# Patient Record
Sex: Male | Born: 1950 | Race: Black or African American | Hispanic: No | Marital: Married | Smoking: Former smoker
Health system: Southern US, Community
[De-identification: ages and names within clinical notes are randomized; demographics above are authoritative.]

## PROBLEM LIST (undated history)

## (undated) DIAGNOSIS — C801 Malignant (primary) neoplasm, unspecified: Secondary | ICD-10-CM

## (undated) DIAGNOSIS — IMO0002 Reserved for concepts with insufficient information to code with codable children: Secondary | ICD-10-CM

## (undated) DIAGNOSIS — S62609A Fracture of unspecified phalanx of unspecified finger, initial encounter for closed fracture: Secondary | ICD-10-CM

---

## 1997-12-16 ENCOUNTER — Encounter: Admission: RE | Admit: 1997-12-16 | Discharge: 1998-03-16 | Payer: Self-pay | Admitting: Psychiatry

## 1999-09-23 ENCOUNTER — Emergency Department (HOSPITAL_COMMUNITY): Admission: EM | Admit: 1999-09-23 | Discharge: 1999-09-23 | Payer: Self-pay | Admitting: Emergency Medicine

## 2000-10-06 ENCOUNTER — Encounter: Payer: Self-pay | Admitting: Emergency Medicine

## 2000-10-06 ENCOUNTER — Emergency Department (HOSPITAL_COMMUNITY): Admission: EM | Admit: 2000-10-06 | Discharge: 2000-10-06 | Payer: Self-pay | Admitting: Emergency Medicine

## 2002-05-12 ENCOUNTER — Encounter: Payer: Self-pay | Admitting: Emergency Medicine

## 2002-05-12 ENCOUNTER — Emergency Department (HOSPITAL_COMMUNITY): Admission: EM | Admit: 2002-05-12 | Discharge: 2002-05-12 | Payer: Self-pay | Admitting: Emergency Medicine

## 2002-10-16 ENCOUNTER — Encounter: Payer: Self-pay | Admitting: Family Medicine

## 2002-10-16 ENCOUNTER — Ambulatory Visit (HOSPITAL_COMMUNITY): Admission: RE | Admit: 2002-10-16 | Discharge: 2002-10-16 | Payer: Self-pay | Admitting: Family Medicine

## 2002-12-10 ENCOUNTER — Encounter: Payer: Self-pay | Admitting: Family Medicine

## 2002-12-10 ENCOUNTER — Ambulatory Visit (HOSPITAL_COMMUNITY): Admission: RE | Admit: 2002-12-10 | Discharge: 2002-12-10 | Payer: Self-pay | Admitting: Family Medicine

## 2003-01-20 ENCOUNTER — Ambulatory Visit (HOSPITAL_COMMUNITY): Admission: RE | Admit: 2003-01-20 | Discharge: 2003-01-20 | Payer: Self-pay

## 2003-01-20 ENCOUNTER — Encounter: Payer: Self-pay | Admitting: Family Medicine

## 2003-03-17 ENCOUNTER — Other Ambulatory Visit: Admission: RE | Admit: 2003-03-17 | Discharge: 2003-03-17 | Payer: Self-pay | Admitting: Urology

## 2003-04-10 HISTORY — PX: PROSTATECTOMY: SHX69

## 2003-04-19 ENCOUNTER — Inpatient Hospital Stay (HOSPITAL_COMMUNITY): Admission: RE | Admit: 2003-04-19 | Discharge: 2003-04-24 | Payer: Self-pay | Admitting: Urology

## 2003-12-15 ENCOUNTER — Emergency Department (HOSPITAL_COMMUNITY): Admission: EM | Admit: 2003-12-15 | Discharge: 2003-12-15 | Payer: Self-pay | Admitting: Emergency Medicine

## 2004-02-15 ENCOUNTER — Ambulatory Visit (HOSPITAL_COMMUNITY): Admission: RE | Admit: 2004-02-15 | Discharge: 2004-02-15 | Payer: Self-pay | Admitting: Family Medicine

## 2004-02-23 ENCOUNTER — Ambulatory Visit (HOSPITAL_COMMUNITY): Admission: RE | Admit: 2004-02-23 | Discharge: 2004-02-23 | Payer: Self-pay | Admitting: Family Medicine

## 2004-11-07 ENCOUNTER — Emergency Department (HOSPITAL_COMMUNITY): Admission: EM | Admit: 2004-11-07 | Discharge: 2004-11-07 | Payer: Self-pay | Admitting: Emergency Medicine

## 2006-07-04 ENCOUNTER — Emergency Department (HOSPITAL_COMMUNITY): Admission: EM | Admit: 2006-07-04 | Discharge: 2006-07-04 | Payer: Self-pay | Admitting: Emergency Medicine

## 2006-09-04 ENCOUNTER — Ambulatory Visit: Payer: Self-pay | Admitting: Internal Medicine

## 2006-09-13 ENCOUNTER — Ambulatory Visit (HOSPITAL_COMMUNITY): Admission: RE | Admit: 2006-09-13 | Discharge: 2006-09-13 | Payer: Self-pay | Admitting: Internal Medicine

## 2006-09-13 ENCOUNTER — Encounter (INDEPENDENT_AMBULATORY_CARE_PROVIDER_SITE_OTHER): Payer: Self-pay | Admitting: Internal Medicine

## 2006-09-13 ENCOUNTER — Ambulatory Visit: Payer: Self-pay | Admitting: Internal Medicine

## 2007-03-04 ENCOUNTER — Ambulatory Visit (HOSPITAL_COMMUNITY): Admission: RE | Admit: 2007-03-04 | Discharge: 2007-03-04 | Payer: Self-pay | Admitting: Family Medicine

## 2008-02-08 ENCOUNTER — Emergency Department (HOSPITAL_COMMUNITY): Admission: EM | Admit: 2008-02-08 | Discharge: 2008-02-08 | Payer: Self-pay | Admitting: Emergency Medicine

## 2008-03-15 ENCOUNTER — Emergency Department (HOSPITAL_COMMUNITY): Admission: EM | Admit: 2008-03-15 | Discharge: 2008-03-15 | Payer: Self-pay | Admitting: Emergency Medicine

## 2008-04-20 ENCOUNTER — Ambulatory Visit (HOSPITAL_COMMUNITY): Admission: RE | Admit: 2008-04-20 | Discharge: 2008-04-20 | Payer: Self-pay | Admitting: Orthopaedic Surgery

## 2009-06-23 ENCOUNTER — Ambulatory Visit (HOSPITAL_COMMUNITY): Admission: RE | Admit: 2009-06-23 | Discharge: 2009-06-23 | Payer: Self-pay | Admitting: Family Medicine

## 2009-06-30 ENCOUNTER — Emergency Department (HOSPITAL_COMMUNITY)
Admission: EM | Admit: 2009-06-30 | Discharge: 2009-06-30 | Payer: Self-pay | Source: Home / Self Care | Admitting: Emergency Medicine

## 2010-08-22 NOTE — Op Note (Signed)
[name], [name]                  ACCOUNT NO.:  [id number]   MEDICAL RECORD NO.:  [id number]          PATIENT TYPE:  AMB   LOCATION:  DAY                           FACILITY:  [facility]   PHYSICIAN:  Lionel December, M.D.    DATE OF BIRTH:  [birth year]   DATE OF PROCEDURE:  09/13/2006  DATE OF DISCHARGE:                               OPERATIVE REPORT   PROCEDURE:  Esophagogastroduodenoscopy followed by colonoscopy with  polypectomy.   INDICATIONS:  [name] is a 60 year old African American male with chronic  heartburn and recurrent epigastric pain.  He is undergoing diagnostic  EGD.  He is also undergoing average risk screening colonoscopy.  Procedure and risks were reviewed with the patient and informed consent  was obtained.   MEDS FOR CONSCIOUS SEDATION:  Benzocaine spray for pharyngeal topical  anesthesia, Demerol 75 mg IV, Versed 10 mg IV in divided doses.   FINDINGS:  Procedure performed in endoscopy suite.  The patient's vital  signs and O2 sat were monitored during the procedure and remained  stable.   PROCEDURE #1:  ESOPHAGOGASTRODUODENOSCOPY:  The patient was placed in  the left lateral recumbent position. The Pentax videoscope was passed  via oropharynx without any difficulty into the esophagus.   ESOPHAGUS:  The mucosa of the esophagus was normal.  GE junction was at  41 cm and hiatus was at 43 cm.  He had a 2 cm long sliding hiatal  hernia.   STOMACH:  It was empty and distended very well with insufflation.  Folds  in the proximal stomach were normal.  Examination of mucosa at body,  antrum, pyloric channel as well as angularis, fundus and cardia was  normal.   DUODENUM:  Bulbar mucosa was normal.  The scope was passed in the second  part of the duodenum where mucosa and folds were normal.  Endoscope was  withdrawn and the patient prepared for procedure #2.   PROCEDURE #2:  COLONOSCOPY:  Rectal examination performed.  No  abnormality noted on external or digital exam.   The Pentax videoscope  was placed in the rectum and advanced under vision into the sigmoid  colon and beyond.  Preparation was satisfactory.  The scope was passed  into the cecum which was identified by the appendiceal orifice and  ileocecal valve.  Pictures were taken for the record.  As the scope was  withdrawn the colonic mucosa was carefully examined.  There was an 8 mm  pedunculated polyp at the proximal sigmoid colon which was snared and  retrieved for histologic examination.  Mucosa of the rest of the colon  was normal.  Rectal mucosa similarly was normal.  Scope was retroflexed  to examine anorectal junction and small hemorrhoids were noted below the  dentate line.  Endoscope was straightened and withdrawn.  The patient  tolerated the procedure well.   FINAL DIAGNOSES:  1. Small sliding hiatal hernia.  Otherwise normal EGD.  2. An 8 mm pedunculated polyp snared from proximal sigmoid colon.  3. Small external hemorrhoids.   RECOMMENDATIONS:  Standard instructions were given.  He should  continue  antireflux measures and omeprazole 20 mg q.a.m. and he should try to  refrain from drinking alcohol as it seemed to trigger his symptoms.   I will be contacting the patient with results of biopsy and further  recommendations.      Lionel December, M.D.  Electronically Signed     NR/MEDQ  D:  09/13/2006  T:  09/13/2006  Job:  [id number]   cc:   Corrie Mckusick, M.D.  Fax: [phone]

## 2010-08-22 NOTE — Consult Note (Signed)
[name], [name]                  ACCOUNT NO.:  [id number]   MEDICAL RECORD NO.:  [id number]          PATIENT TYPE:  AMB   LOCATION:                                FACILITY:  [facility]   PHYSICIAN:  Lionel December, M.D.    DATE OF BIRTH:  [birth year]   DATE OF CONSULTATION:  DATE OF DISCHARGE:                                 CONSULTATION   REASON FOR CONSULTATION/CHIEF COMPLAINT:  Daily indigestion and  heartburn and epigastric pain.   HISTORY OF PRESENT ILLNESS:  [name] is a 60 year old male who tells me  he has had symptoms intermittently over many years of intermittent  indigestion and heartburn.  He tells me the symptoms have been worse  over the last couple of weeks.  He is having symptoms around 4 days a  week where he has severe heartburn and indigestion, usually worse after  he has been binge-drinking.  He tells me he usually has a couple of  beers 3-4 days a week, but also has been known to have up to a full case  of beer.  He notes his epigastric pain is worse after the consumption of  alcohol.  He denies any nausea, vomiting, dysphagia, odynophagia or  lower abdominal pain.  He denies any rectal bleeding or melena.  Denies  any anorexia or early satiety.  Denies any diarrhea or constipation.  He  has been on Nexium 40 mg daily for about 3 weeks which does seem to  help.   PAST MEDICAL HISTORY:  Includes prostate cancer status post radical  prostatectomy in January 2005.  He has history of acute duodenitis in  1984 on EGD by Dr. Crista Luria.  He has a remote history of peptic ulcer  disease dating back to the 1970s.  Spontaneous left pneumothorax twice  in 1986 and in 1990.  Left knee arthroscopy for a meniscal tear back in  the 1990s   CURRENT MEDICATIONS:  Denies any.   ALLERGIES:  CODEINE.   FAMILY HISTORY:  Positive for a brother diagnosed with colon cancer at  age 43.  Mother at age 78 has hypertension.  Father deceased at age 8  due to MVA.  Family history is also  positive for diabetes mellitus and  hypercholesterolemia.   SOCIAL HISTORY:  [name] is married.  He has two grown healthy children.  He is a self-employed Copy.  He has a 40 pack-year history of tobacco  use.  He has a history of past marijuana use but denies any IV or  intranasal drug use.  See HPI for alcohol consumption.   REVIEW OF SYSTEMS:  CONSTITUTIONAL:  Weight has remained stable.  Denies  any fever or chills.  CARDIOVASCULAR:  Denies any chest pain or  palpitation.  PULMONARY:  Denies any shortness of breath, dyspnea, cough  or hemoptysis.  GI:  See HPI.  GU:  Denies any dysuria, hematuria, or  increased urinary frequency.   PHYSICAL EXAMINATION:  VITAL SIGNS:  Weight 165 pounds, height 74  inches, temperature 98.1, blood pressure 110/60, pulse 72.  GENERAL:  [name] is a well-developed, well-nourished African-American  male in no acute distress.  HEENT.  Sclerae clear, nonicteric.  Conjunctivae pink.  Oropharynx pink  and moist without any lesions.  NECK:  Supple without mass or thyromegaly.  CHEST:  Heart regular and rhythm, normal S1, S2, without any murmurs,  clicks, rubs or gallops.  Lungs clear to auscultation bilaterally.  ABDOMEN:  Positive bowel sounds x4.  No bruits auscultated.  Soft,  nontender, nondistended, without palpable mass or hepatosplenomegaly.  No rebound tenderness or guarding.  EXTREMITIES:  Without clubbing or edema bilaterally.  SKIN:  Brown, warm and dry without any rash or jaundice.   IMPRESSION:  [name] is a 60 year old male with a history of frequent  heartburn and indigestion as well as epigastric pain.  His symptoms are  made worse with episodic increased alcohol consumption.  I suspect he  may have gastritis or could have even developed peptic ulcer disease as  he carries history of this.  He is going to require further evaluation.  He has responded to PPI.   He also has a family history of colon cancer and will need high-risk   screening colonoscopy at the same time.   PLAN:  1. Omeprazole 20 mg daily #31 with five refills.  2. Screening colonoscopy and EGD with Dr. Karilyn Cota in the near future.      I have discussed the procedure including risks and benefits which      include but are not limited to bleeding, infection, perforation,      drug reaction.  He agrees to the plan.  Consent will be obtained.   We would like to thank Dr. Phillips Odor for allowing [place] to participate in the  care of Mr. Maiden.      Nicholas Lose, N.P.      Lionel December, M.D.  Electronically Signed    KC/MEDQ  D:  09/04/2006  T:  09/04/2006  Job:  [id number]   cc:   Corrie Mckusick, M.D.  Fax: [phone]

## 2010-08-25 ENCOUNTER — Other Ambulatory Visit (HOSPITAL_COMMUNITY): Payer: Self-pay | Admitting: Family Medicine

## 2010-08-25 ENCOUNTER — Ambulatory Visit (HOSPITAL_COMMUNITY)
Admission: RE | Admit: 2010-08-25 | Discharge: 2010-08-25 | Disposition: A | Discharge status: Home / Self Care | Payer: 59 | Source: Ambulatory Visit | Attending: Family Medicine | Admitting: Family Medicine

## 2010-08-25 DIAGNOSIS — R079 Chest pain, unspecified: Secondary | ICD-10-CM

## 2010-08-25 DIAGNOSIS — R918 Other nonspecific abnormal finding of lung field: Secondary | ICD-10-CM | POA: Insufficient documentation

## 2010-08-25 DIAGNOSIS — R0789 Other chest pain: Secondary | ICD-10-CM | POA: Insufficient documentation

## 2010-08-25 NOTE — H&P (Signed)
[name], [name] NO.:  [id number]   MEDICAL RECORD NO.:  [id number]                  PATIENT TYPE:   LOCATION:                                       FACILITY:   PHYSICIAN:  Ky Barban, M.D.            DATE OF BIRTH:   DATE OF ADMISSION:  DATE OF DISCHARGE:                                HISTORY & PHYSICAL   CHIEF COMPLAINT:  Carcinoma of prostate.   HISTORY OF PRESENT ILLNESS:  The patient is a 60 year old gentleman who was  referred to me by Dr. Phillips Odor on February 11, 2003 because his PSA was 5.47  and he has no urological complaints.  I repeated his PSA and it has already  gone up from 5.47 to 6.34 and free PSA is only 9%.  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  Because of his age I recommended that we need  to decide between having a prostatectomy versus radiotherapy.  I discussed  with him and his wife pros and cons of both procedures because of younger  age and I recommended that he undergo radical prostatectomy.  If he is  agreeable especially these three things (1) urinary incontinence which can  become permanent, (2) erectile dysfunction, he will develop impotence and  (3) blood transfusion and he wanted me to go ahead and do a radical  prostatectomy.  He is willing to take these risks.  He is otherwise  medically having no other medical problems.   PAST MEDICAL HISTORY:  No history of diabetes or hypertension.   FAMILY HISTORY:  One brother has prostate cancer.   PERSONAL HISTORY:  Patient smokes one pack per day.  Does drink but doesn't  know how much.   REVIEW OF SYMPTOMS:  Unremarkable.   PHYSICAL EXAMINATION:  VITAL SIGNS:  Blood pressure 126/70, temperature 98.  CNS:  No gross neurological deficits.  HEENT:  Negative.  CHEST:  Symmetrical.  HEART:   Regular sinus rhythm.  ABDOMEN:  Soft, flat.  Liver, spleen, kidneys not palpable.  No  costovertebral angle tenderness.  GENITOURINARY:  Circumcised.  Meatus is adequate.  Testicles normal.  RECTAL:  Normal sphincter tone.  No rectal masses.  Prostate 1+, smooth and  firm.   IMPRESSION:  Carcinoma of prostate.   PLAN:  Bilateral pelvic node dissection, frozen sections and radical  retropubic prostatectomy.                                                Ky Barban, M.D.   MIJ/MEDQ  D:  04/16/2003  T:  04/16/2003  Job:  [id number]

## 2010-08-25 NOTE — Op Note (Signed)
NAME:  JAZZ, ROGALA                            ACCOUNT NO.:  [id number]   MEDICAL RECORD NO.:  [id number]                   PATIENT TYPE:  AMB   LOCATION:  DAY                                  FACILITY:  [facility]   PHYSICIAN:  Ky Barban, M.D.            DATE OF BIRTH:  1950-04-19   DATE OF PROCEDURE:  04/19/2003  DATE OF DISCHARGE:                                 OPERATIVE REPORT   PREOPERATIVE DIAGNOSIS:  Carcinoma of prostate.   POSTOPERATIVE DIAGNOSIS:  Carcinoma of prostate.   PROCEDURE:  Bilateral pelvic node dissection, radical prostatectomy.   ANESTHESIA:  General endotracheal.   SURGEON:  Ky Barban, M.D.   ASSISTANT:  Dennie Maizes, M.D.   ESTIMATED BLOOD LOSS:  400 mL.   BLOOD REPLACED:  None.   COUNTS:  Needle, sponge, and instrument counts correct.   COMPLICATIONS:  None.   DESCRIPTION OF PROCEDURE:  The patient is given general endotracheal  anesthesia, placed in semilithotomy position.  A midline suprapubic incision  is made and carried down through the subcutaneous tissue.  The rectus sheath  is incised.  The recti separated in the midline and retropubic space is  entered.  The external iliac vessels are exposed on both sides and self-  retaining Turner-Warwick retractor is applied.  The fascia over the external  iliac vein on the right side is opened and a tissue plane is developed  between the bifurcation of the right common iliac vein and obturator  vessels.  The triangle between the right external iliac vein, obturator  vessel, the tissue which is covering the psoas fascia was removed.  Lymphatics were clipped.  Lymph nodes were removed and the obturator nerve  and vessels were skeletonized.  Specimen was sent for frozen section.  The  same procedure was repeated on the left side and I can see that the left  obturator vein is rather large, almost the size of the external iliac vein.  The report of the frozen section came back as negative  for metastatic  disease and the procedure to do a radical retropubic prostatectomy.  The  fascia, endopelvic fascia on both sides of the apex of the prostate was  open.  A McDougal clamp was passed behind the dorsal vein complex.  It was  ligated and divided.  The Foley catheter was inserted which I forgot to  insert in the initial setting.  I had to do cystoscopy because we were  having trouble putting in the Foley catheter.  There was no problem in the  urethra.  The Foley catheter was inserted.  Then we proceeded with the rest  of the procedure and the urethra was identified.  It was lifted up over the  right angle and divided.  Anterior wall of the urethra was divided and it  was sutured to the dorsal vein complex with a 3-0 Vicryl continuous stitch.  Then the  Foley catheter was grabbed with the Vanderbilt clamp and divided,  leaving the clamp on the proximal end.  The posterior urethra was then next  divided.  The prostate apex was lifted off, the rectum was pushed away and  left back. The inferior pedicle of the prostate were divided and ligated.  The fascia covering the seminal vesicle was divided.  The vas deferens were  identified.  They were clipped and divided.  Seminal vesicles were pulled  out of the sheath with blunt and sharp dissection clipping the seminal  vesicle artery.  Both seminal vesicles came out.  Then the remaining part of  the sheath covering the seminal vesicles on both sides were divided.  Superior pedicles on both sides were divided. The superior pedicles were  clamped and divided on both sides.  Now proceeded with the, anterior bladder  neck was opened up.  The bladder was opened and the ureteral orifices were  catheterized with #4 ureteral catheters on both sides and stabilized to the  bladder with 4-0 chromic stitch.  The posterior bladder neck was divided and  bladder separated from the prostate.  The prostate came out.  Operative side  was irrigated,  checked for any bleeders.  The bladder neck was closed with 2-  0 Vicryl stitch on each side leaving the bladder neck size of my index  finger.   Next, the bladder mucosa was everted to cover the bladder neck margins with  a continuous stitch of 4-0 Vicryl.  Now the anastomosis, #20 Foley catheter  was inserted into the urethra.  The pelvic end of the catheter, I passed a  free tie to facilitate the in and out movement of the catheter from the  urethral stump.  Six stitches between the urethra and the bladder neck were  placed and the Foley catheter was then placed into the bladder.  The  superior blade of the retractor was then removed.  The bladder was pushed  into the retropubic space.  Balloon of the Foley catheter inflated with  retraction on the Foley catheter, the urethra and the bladder neck was  approximated. All six stitches of bladder neck were tied.  In the same  sequence they were placed in.  A couple of these stitches were tied to the  dorsal vein stitches and the bladder was irrigated and filled up, no leak  from the anastomosis.  The urethral catheters were brought out through a  separate stab wound in the bladder.  A __________ shunt was placed in the  retropubic space and the ureteral catheter came out alongside the __________  shunt, stabilized to the skin with a 0 silk stitch.  The reflector blades  were removed.  Foley catheter was on traction and I closed the rectus sheath  with continuous stitch of 0 Vicryl.  Skin was closed with staples.  The  wound was infiltrated with 10 mL of 0.5% Marcaine and the sterile dressing  was applied.  Foley catheter was left on traction.  The patient left the  operating room in satisfactory condition.  He lost about 400 mL of blood and  it was not replaced.                                               Ky Barban, M.D.    MIJ/MEDQ  D:  04/19/2003  T:  04/19/2003  Job:  [id number]

## 2010-08-25 NOTE — Discharge Summary (Signed)
[name], [name]                            ACCOUNT NO.:  [id number]   MEDICAL RECORD NO.:  [id number]                   PATIENT TYPE:  INP   LOCATION:  A217                                 FACILITY:  [facility]   PHYSICIAN:  Ky Barban, M.D.            DATE OF BIRTH:  01/30/1951   DATE OF ADMISSION:  04/19/2003  DATE OF DISCHARGE:  04/24/2003                                 DISCHARGE SUMMARY   CHIEF COMPLAINT:  Carcinoma of the prostate.   BRIEF HISTORY:  This 60 year old gentleman was found to have elevated PSA at  5.47.  Prostate biopsy showed that he has adenocarcinoma of the prostate,  and I also want to mention that his __________ PSA was only 9%.  I advised  him to undergo radical prostatectomy versus radiotherapy.  They elected to  have radical prostatectomy.  Complications were discussed in detail,  especially urinary incontinence, erectile dysfunction, and use of blood  transfusion.  He underwent routine preadmission workup.  CBC, urinalysis,  Astra-7, chest x-ray are normal.  He was taken to the operating room on  April 19, 2003, underwent radical retropubic prostatectomy, bilateral  pelvic node dissection.  He lost about 400 mL of blood, did not require any  blood transfusion.  On the first postoperative day his temperature was 101.  General status is good.  Abdomen is soft.  Bowel sounds are present.  Chest  is clear.  Sump drained about 50 mL.  WBC count is 10.3, hematocrit is 25.8.  Satisfactory postoperative course.  He was started on clear liquids, and I  encouraged him to use incentive spirometry.  On the second postoperative day  his temperature is down, passing flatus, intake and output are satisfactory,  hematocrit is stable at 25.6.  His sump was discontinued, started on regular  diet, and he was discharged from ICU.  On the third postoperative day, blood  pressure 125/63, pulse 98, temperature 98.9.  Abdomen soft, flat,  nondistended.  On April 23, 2003, he is doing fine.  Urethral catheters  are discontinued.  I started him on oral iron, ferrous gluconate.  Wound is  healing up primarily.  On April 24, 2003, he is discharged home with Foley  catheter, and he had bowel movement.  He is eating regular food.  Final  pathology report is back.  As I mentioned, his frozen sections from both  right and left pelvic nodes were negative.  The final pathology on the  prostate showed adenocarcinoma of the prostate, intermediate grade, Gleason  score is 5, involved both right and left lobes of the prostate.  There is no  capsular extension.  Urethral and bladder margins are negative.  Seminal  vesicles negative.  No structures other than prostate involved. His TNM code  is T2c, N0, MX.  The patient is being discharged.  He will be followed up by  me in the office.  FINAL DISCHARGE DIAGNOSIS:  Carcinoma of the prostate.   DISCHARGE CONDITION:  Improved.   DISCHARGE MEDICATIONS:  1. Levaquin 500 mg 1 p.o. daily, #4.  2. Percocet 7.5 mg 1 q.6h. p.r.n., #30.  3. Ferrous gluconate 325, 1 p.o. daily, #30.   Report to the office Monday so I can take the stitches out.     ___________________________________________                                         Ky Barban, M.D.   MIJ/MEDQ  D:  05/07/2003  T:  05/08/2003  Job:  [id number]   cc:   Corrie Mckusick, M.D.  [address] [place] [place]  [place]   [id number]  Fax: [phone]

## 2010-09-25 ENCOUNTER — Ambulatory Visit (INDEPENDENT_AMBULATORY_CARE_PROVIDER_SITE_OTHER): Payer: 59 | Admitting: Internal Medicine

## 2011-09-11 ENCOUNTER — Encounter (INDEPENDENT_AMBULATORY_CARE_PROVIDER_SITE_OTHER): Payer: Self-pay | Admitting: *Deleted

## 2011-09-20 ENCOUNTER — Other Ambulatory Visit (INDEPENDENT_AMBULATORY_CARE_PROVIDER_SITE_OTHER): Payer: Self-pay | Admitting: *Deleted

## 2011-09-20 ENCOUNTER — Telehealth (INDEPENDENT_AMBULATORY_CARE_PROVIDER_SITE_OTHER): Payer: Self-pay | Admitting: *Deleted

## 2011-09-20 DIAGNOSIS — Z1211 Encounter for screening for malignant neoplasm of colon: Secondary | ICD-10-CM

## 2011-09-20 DIAGNOSIS — Z86010 Personal history of colonic polyps: Secondary | ICD-10-CM

## 2011-09-20 DIAGNOSIS — Z800 Family history of malignant neoplasm of digestive organs: Secondary | ICD-10-CM

## 2011-09-20 NOTE — Telephone Encounter (Signed)
Patient needs movi prep 

## 2011-09-21 MED ORDER — PEG-KCL-NACL-NASULF-NA ASC-C 100 G PO SOLR: 1.0000 | Freq: Once | ORAL | Status: DC

## 2011-10-29 ENCOUNTER — Telehealth (INDEPENDENT_AMBULATORY_CARE_PROVIDER_SITE_OTHER): Payer: Self-pay | Admitting: *Deleted

## 2011-10-29 NOTE — Telephone Encounter (Signed)
PCP/Requesting MD: golding  Name & DOB: [name] [birth year]  Procedure: tcs  Reason/Indication:  Hx polyps, fam hx colon ca  Has patient had this procedure before?  Yes  if so, when, by whom and where?  2008  Is there a family history of colon cancer?  yes  Who?  What age when diagnosed?  Half brother  Is patient diabetic?   no      Does patient have prosthetic heart valve?  no  Do you have a pacemaker?  no  Has patient had joint replacement within last 12 months?  no  Is patient on Coumadin, Plavix and/or Aspirin? no  Medications: none  Allergies: codeine  Medication Adjustment:   Procedure date & time: 11/28/11 at 930

## 2011-10-29 NOTE — Telephone Encounter (Signed)
agree

## 2011-10-31 ENCOUNTER — Other Ambulatory Visit (HOSPITAL_COMMUNITY): Payer: Self-pay | Admitting: Family Medicine

## 2011-10-31 ENCOUNTER — Ambulatory Visit (HOSPITAL_COMMUNITY)
Admission: RE | Admit: 2011-10-31 | Discharge: 2011-10-31 | Disposition: A | Discharge status: Home / Self Care | Payer: 59 | Source: Ambulatory Visit | Attending: Family Medicine | Admitting: Family Medicine

## 2011-10-31 DIAGNOSIS — R059 Cough, unspecified: Secondary | ICD-10-CM | POA: Insufficient documentation

## 2011-10-31 DIAGNOSIS — R05 Cough: Secondary | ICD-10-CM

## 2011-10-31 DIAGNOSIS — J438 Other emphysema: Secondary | ICD-10-CM | POA: Insufficient documentation

## 2011-11-15 ENCOUNTER — Encounter (HOSPITAL_COMMUNITY): Payer: Self-pay | Admitting: Pharmacy Technician

## 2011-11-28 ENCOUNTER — Encounter (HOSPITAL_COMMUNITY)
Admission: RE | Disposition: A | Discharge status: Home / Self Care | Payer: Self-pay | Source: Ambulatory Visit | Attending: Internal Medicine

## 2011-11-28 ENCOUNTER — Ambulatory Visit (HOSPITAL_COMMUNITY)
Admission: RE | Admit: 2011-11-28 | Discharge: 2011-11-28 | Disposition: A | Discharge status: Home / Self Care | Payer: 59 | Source: Ambulatory Visit | Attending: Internal Medicine | Admitting: Internal Medicine

## 2011-11-28 ENCOUNTER — Encounter (HOSPITAL_COMMUNITY): Payer: Self-pay | Admitting: *Deleted

## 2011-11-28 DIAGNOSIS — Z860100 Personal history of colon polyps, unspecified: Secondary | ICD-10-CM | POA: Insufficient documentation

## 2011-11-28 DIAGNOSIS — Z800 Family history of malignant neoplasm of digestive organs: Secondary | ICD-10-CM | POA: Insufficient documentation

## 2011-11-28 DIAGNOSIS — K648 Other hemorrhoids: Secondary | ICD-10-CM | POA: Insufficient documentation

## 2011-11-28 DIAGNOSIS — K644 Residual hemorrhoidal skin tags: Secondary | ICD-10-CM

## 2011-11-28 DIAGNOSIS — D126 Benign neoplasm of colon, unspecified: Secondary | ICD-10-CM

## 2011-11-28 HISTORY — PX: COLONOSCOPY: SHX5424

## 2011-11-28 SURGERY — COLONOSCOPY
Anesthesia: Moderate Sedation

## 2011-11-28 MED ORDER — MEPERIDINE HCL 50 MG/ML IJ SOLN
INTRAMUSCULAR | Status: DC | PRN
  Administered 2011-11-28 (×2): 25 mg via INTRAVENOUS

## 2011-11-28 MED ORDER — MEPERIDINE HCL 50 MG/ML IJ SOLN
INTRAMUSCULAR | Status: AC
  Filled 2011-11-28: qty 1

## 2011-11-28 MED ORDER — SODIUM CHLORIDE 0.45 % IV SOLN
Freq: Once | INTRAVENOUS | Status: AC
  Administered 2011-11-28: 09:00:00 via INTRAVENOUS

## 2011-11-28 MED ORDER — MIDAZOLAM HCL 5 MG/5ML IJ SOLN
INTRAMUSCULAR | Status: AC
  Filled 2011-11-28: qty 10

## 2011-11-28 MED ORDER — MIDAZOLAM HCL 5 MG/5ML IJ SOLN
INTRAMUSCULAR | Status: DC | PRN
  Administered 2011-11-28: 2 mg via INTRAVENOUS
  Administered 2011-11-28: 1 mg via INTRAVENOUS
  Administered 2011-11-28: 2 mg via INTRAVENOUS

## 2011-11-28 MED ORDER — STERILE WATER FOR IRRIGATION IR SOLN
Status: DC | PRN
  Administered 2011-11-28: 09:00:00

## 2011-11-28 NOTE — H&P (Signed)
[name] is an 61 y.o. male.   Chief Complaint: Patient is here for colonoscopy. HPI: Patient is 61 year old African male who is here for surveillance colonoscopy. His last exam was in June 2008 with removal of 8 mm pedunculated tubular adenoma. He denies abdominal pain change in bowel habits or rectal bleeding. His brother had surgery for colon carcinoma at 64 or 84 and is doing fine at age 74. Patient had radical prostatectomy in 2005 for prostate cancer and remains in remission Family history is significant for other malignancies. Both maternal grandparents had stomach cancer. One sister died of stomach cancer in her early 55,s. One brother has been treated for prostate carcinoma.  History reviewed. No pertinent past medical history.  Past Surgical History  Procedure Date  . Prostatectomy 2005    [facility]-Dr. Jerre Simon    Family History  Problem Relation Age of Onset  . Colon cancer Brother    Social History:  reports that he has quit smoking. His smoking use included Cigarettes. He has a 45 pack-year smoking history. He does not have any smokeless tobacco history on file. He reports that he drinks about 12 ounces of alcohol per week. He reports that he does not use illicit drugs.  Allergies:  Allergies  Allergen Reactions  . Codeine Itching    Medications Prior to Admission  Medication Sig Dispense Refill  . peg 3350 powder (MOVIPREP) 100 G SOLR Take 1 kit (100 g total) by mouth once.  1 kit  0    No results found for this or any previous visit (from the past 48 hour(s)). No results found.  ROS  Blood pressure 126/83, temperature 97.7 F (36.5 C), temperature source Oral, resp. rate 22, height 6\' 2"  (1.88 m), weight 175 lb (79.379 kg), SpO2 96.00%. Physical Exam  Constitutional: He appears well-developed and well-nourished.  HENT:  Mouth/Throat: Oropharynx is clear and moist.  Eyes: Conjunctivae are normal. No scleral icterus.  Neck: No thyromegaly present.   Cardiovascular: Normal rate, regular rhythm and normal heart sounds.   No murmur heard. Respiratory: Effort normal and breath sounds normal.  GI: Soft. He exhibits no distension and no mass. There is no tenderness.       Lower midline scar  Musculoskeletal: He exhibits no edema.  Lymphadenopathy:    He has no cervical adenopathy.  Skin: Skin is warm and dry.     Assessment/Plan History of colonic adenoma. Family history of colon carcinoma. Surveillance colonoscopy.  AustinNAJEEB Mitchell 11/28/2011, 9:14 AM

## 2011-11-28 NOTE — Op Note (Signed)
COLONOSCOPY PROCEDURE REPORT  PATIENT:  [name]  MR#:  [id number] Birthdate:  14-Apr-1950, 61 y.o., male Endoscopist:  Dr. Malissa Hippo, MD Referred By:  Dr. Colette Ribas, MD Procedure Date: 11/28/2011  Procedure:   Colonoscopy with snare polypectomy.  Indications:  Patient is 61 year old African American male with history of colonic adenoma, family history of colon carcinoma in an older brother as well as family history of other malignancies. His last exam was in June 2008 with removal of 8mm pedunculated adenoma.  Informed Consent:  The procedure and risks were reviewed with the patient and informed consent was obtained.  Medications:  Demerol 50 mg IV Versed 5 mg IV  Description of procedure:  After a digital rectal exam was performed, that colonoscope was advanced from the anus through the rectum and colon to the area of the cecum, ileocecal valve and appendiceal orifice. The cecum was deeply intubated. These structures were well-seen and photographed for the record. From the level of the cecum and ileocecal valve, the scope was slowly and cautiously withdrawn. The mucosal surfaces were carefully surveyed utilizing scope tip to flexion to facilitate fold flattening as needed. The scope was pulled down into the rectum where a thorough exam including retroflexion was performed.  Findings:   Prep excellent. 12 mm pedunculated polyp snared from proximal sigmoid colon. Normal rectal mucosa. Hemorrhoids above and below the dentate line.  Therapeutic/Diagnostic Maneuvers Performed:  See above  Complications:  None  Cecal Withdrawal Time:  15 minutes  Impression:  Examination performed to cecum. 12 mm pedunculated polyp snared from proximal sigmoid colon. External/internal hemorrhoids.  Recommendations:  Standard instructions given. I will contact patient with see results and further recommendations.  [name],[name]  11/28/2011 10:01 AM  CC: Dr. Colette Ribas,  MD & Dr. Bonnetta Barry ref. provider found

## 2011-12-03 ENCOUNTER — Encounter (HOSPITAL_COMMUNITY): Payer: Self-pay | Admitting: Internal Medicine

## 2011-12-12 ENCOUNTER — Encounter (INDEPENDENT_AMBULATORY_CARE_PROVIDER_SITE_OTHER): Payer: Self-pay | Admitting: *Deleted

## 2012-01-23 ENCOUNTER — Emergency Department (HOSPITAL_COMMUNITY): Payer: 59

## 2012-01-23 ENCOUNTER — Encounter (HOSPITAL_COMMUNITY): Payer: Self-pay | Admitting: *Deleted

## 2012-01-23 ENCOUNTER — Emergency Department (HOSPITAL_COMMUNITY)
Admission: EM | Admit: 2012-01-23 | Discharge: 2012-01-23 | Disposition: A | Discharge status: Home / Self Care | Payer: 59 | Attending: Emergency Medicine | Admitting: Emergency Medicine

## 2012-01-23 DIAGNOSIS — M25569 Pain in unspecified knee: Secondary | ICD-10-CM | POA: Insufficient documentation

## 2012-01-23 DIAGNOSIS — W1789XA Other fall from one level to another, initial encounter: Secondary | ICD-10-CM | POA: Insufficient documentation

## 2012-01-23 DIAGNOSIS — S82143A Displaced bicondylar fracture of unspecified tibia, initial encounter for closed fracture: Secondary | ICD-10-CM

## 2012-01-23 DIAGNOSIS — Y9289 Other specified places as the place of occurrence of the external cause: Secondary | ICD-10-CM | POA: Insufficient documentation

## 2012-01-23 DIAGNOSIS — Y9301 Activity, walking, marching and hiking: Secondary | ICD-10-CM | POA: Insufficient documentation

## 2012-01-23 MED ORDER — OXYCODONE-ACETAMINOPHEN 5-325 MG PO TABS: 1.0000 | ORAL_TABLET | ORAL | Status: DC | PRN

## 2012-01-23 MED ORDER — HYDROMORPHONE HCL PF 1 MG/ML IJ SOLN
1.0000 mg | Freq: Once | INTRAMUSCULAR | Status: AC
  Administered 2012-01-23: 1 mg via INTRAMUSCULAR
  Filled 2012-01-23: qty 1

## 2012-01-23 NOTE — ED Notes (Signed)
Pt fell today while out in the yard when he stepped into a hole, pt c/o pain to right knee area, cms intact distal

## 2012-01-23 NOTE — ED Provider Notes (Signed)
History     CSN: [id number]  Arrival date & time 01/23/12  1502   First MD Initiated Contact with Patient 01/23/12 1541      Chief Complaint  Patient presents with  . Fall    (Consider location/radiation/quality/duration/timing/severity/associated sxs/prior treatment) HPI Comments: Patient complains of pain to his right knee that occurred several hours ago while walking in the woods. He states that he stepped in a hole and fell on his right knee. He states that he was unable to bear weight on his knee after the fall do to the level of pain. He also complains of pain radiating down his lower leg. Pain is worse with movement of the knee and improves somewhat with the knee slightly bent and elevated. He denies head injury, neck pain, back pain, or LOC. He also denies numbness or tingling to the affected extremity or proximal tenderness or pain to the right ankle.  He does admit to drinking one can of beer prior to ED arrival  Patient is a 61 y.o. male presenting with fall. The history is provided by the patient.  Fall The accident occurred 1 to 2 hours ago. The fall occurred while walking. He landed on dirt. There was no blood loss. The point of impact was the right knee. The pain is present in the right knee. The pain is moderate. He was not ambulatory at the scene. There was no entrapment after the fall. There was no drug use involved in the accident. There was alcohol use involved in the accident. Pertinent negatives include no fever, no numbness, no abdominal pain, no bowel incontinence, no nausea, no vomiting, no headaches, no hearing loss, no loss of consciousness and no tingling. The symptoms are aggravated by activity and pressure on the injury (Movement or palpation). He has tried nothing for the symptoms. The treatment provided no relief.    History reviewed. No pertinent past medical history.  Past Surgical History  Procedure Date  . Prostatectomy 2005    [facility]  Jerre Simon  . Colonoscopy 11/28/2011    Procedure: COLONOSCOPY;  Surgeon: Malissa Hippo, MD;  Location: [facility];  Service: Endoscopy;  Laterality: N/A;  930    Family History  Problem Relation Age of Onset  . Colon cancer Brother     History  Substance Use Topics  . Smoking status: Former Smoker -- 1.0 packs/day for 45 years    Types: Cigarettes  . Smokeless tobacco: Not on file  . Alcohol Use: 12.0 oz/week    20 Cans of beer per week     20 cans of beer week and little less than a half a pint per week      Review of Systems  Constitutional: Negative for fever, chills and appetite change.  HENT: Negative for neck pain.   Respiratory: Negative for chest tightness and shortness of breath.   Cardiovascular: Negative for chest pain.  Gastrointestinal: Negative for nausea, vomiting, abdominal pain and bowel incontinence.  Genitourinary: Negative for dysuria, flank pain and difficulty urinating.  Musculoskeletal: Positive for joint swelling, arthralgias and gait problem. Negative for back pain.  Skin: Negative for color change and wound.  Neurological: Negative for dizziness, tingling, loss of consciousness, numbness and headaches.  Psychiatric/Behavioral: Negative for confusion.  All other systems reviewed and are negative.    Allergies  Codeine  Home Medications  No current outpatient prescriptions on file.  BP 121/82  Pulse 77  Temp 97.9 F (36.6 C) (Oral)  Resp 22  Ht 6\' 2"  (1.88 m)  Wt 180 lb (81.647 kg)  BMI 23.11 kg/m2  SpO2 100%  Physical Exam  Nursing note and vitals reviewed. Constitutional: He is oriented to person, place, and time. He appears well-developed and well-nourished. No distress.  HENT:  Head: Normocephalic and atraumatic.  Eyes: EOM are normal. Pupils are equal, round, and reactive to light.  Neck: Normal range of motion. Neck supple.  Cardiovascular: Normal rate, regular rhythm, normal heart sounds and intact distal pulses.     Pulmonary/Chest: Effort normal and breath sounds normal.  Musculoskeletal: He exhibits edema and tenderness.       Right knee: He exhibits decreased range of motion, swelling and bony tenderness. He exhibits no ecchymosis, no deformity, no laceration, no erythema and normal patellar mobility.       Legs:      ttp of the anterior right knee. Mild to moderate soft tissue swelling is present.  Tender to palp at the proximal tibia.   No erythema, bruising or step off deformity.  Right hip and ankle are non-tender.  Dorsalis pedis pulse is brisk, distal sensation intact,  Lymphadenopathy:    He has no cervical adenopathy.  Neurological: He is alert and oriented to person, place, and time. He exhibits normal muscle tone. Coordination normal.  Skin: Skin is warm and dry. No erythema.  Psychiatric: He has a normal mood and affect. His behavior is normal. Thought content normal.    ED Course  Procedures (including critical care time)  Labs Reviewed - No data to display Dg Chest 1 View  01/23/2012  *RADIOLOGY REPORT*  Clinical Data: Knee fracture post fall  CHEST - 1 VIEW  Comparison: 10/31/2011  Findings: Upper normal heart size. Mediastinal contours and pulmonary vascularity normal. Bronchitic changes without infiltrate, pleural effusion or pneumothorax. Emphysematous changes are better visualized on the previous study. No acute osseous abnormalities.  IMPRESSION: Emphysematous and bronchitic changes consistent with COPD. No acute abnormalities.   Original Report Authenticated By: Lollie Marrow, M.D.    Dg Knee Complete 4 Views Right  01/23/2012  *RADIOLOGY REPORT*  Clinical Data: Knee injury with pain.  RIGHT KNEE - COMPLETE 4+ VIEW  Comparison: None.  Findings: Three-view study of the right knee shows a comminuted fracture of the medial tibial plateau.  Fracture line extends over to the lateral metaphysis and cranially to also involve the lateral tibial plateau.  No evidence for fracture the distal  femur.  There does appear to be a small joint effusion.  IMPRESSION: Bilateral tibial plateau fracture.  CT imaging could be used to further characterize, as clinically warranted.  There may be an associated fracture of the proximal fibula, but this is not well seen.   Original Report Authenticated By: ERIC A. MANSELL, M.D.     Dg Tibia/fibula Right  01/23/2012  *RADIOLOGY REPORT*  Clinical Data: Knee fracture  RIGHT TIBIA AND FIBULA - 2 VIEW  Comparison: Right knee same day  Findings: Two views of the right tibia-fibula submitted.  Again noted fracture of medial aspect tibial plateau.  IMPRESSION: Again noted fracture of proximal right tibia.   Original Report Authenticated By: Natasha Mead, M.D.     Knee immobilizer placed by nursing staff, pain improved, patient remains neurovascularly intact.  MDM     Consulted Dr. Romeo Apple, he reviewed the images, advised to place the patient in a knee immobilizer, he will see the patient in his office tomorrow morning (01/24/12) at 10:00 am.    It was stressed  to the patient not to bear any weight to the right leg and to keep the immobilizer on until follow-up with Dr. Romeo Apple.  Patient and family member agree to care plan and verbalized understanding.    Prescribed: Percocet #25   Daphne Karrer L. [place], [place] 01/23/12 1733

## 2012-01-24 ENCOUNTER — Ambulatory Visit (INDEPENDENT_AMBULATORY_CARE_PROVIDER_SITE_OTHER): Payer: 59 | Admitting: Orthopedic Surgery

## 2012-01-24 ENCOUNTER — Encounter: Payer: Self-pay | Admitting: Orthopedic Surgery

## 2012-01-24 VITALS — BP 110/60 | Ht 74.0 in | Wt 180.0 lb

## 2012-01-24 DIAGNOSIS — S82143A Displaced bicondylar fracture of unspecified tibia, initial encounter for closed fracture: Secondary | ICD-10-CM

## 2012-01-24 DIAGNOSIS — S82109A Unspecified fracture of upper end of unspecified tibia, initial encounter for closed fracture: Secondary | ICD-10-CM

## 2012-01-24 DIAGNOSIS — M25469 Effusion, unspecified knee: Secondary | ICD-10-CM

## 2012-01-24 MED ORDER — OXYCODONE-ACETAMINOPHEN 10-325 MG PO TABS: 1.0000 | ORAL_TABLET | ORAL | Status: DC | PRN

## 2012-01-24 MED ORDER — DIPHENHYDRAMINE HCL 25 MG PO CAPS: 25.0000 mg | ORAL_CAPSULE | ORAL | Status: DC | PRN

## 2012-01-24 NOTE — Patient Instructions (Addendum)
Brace at all times except to bathe   toe touch weight bearing

## 2012-01-24 NOTE — Progress Notes (Signed)
Patient ID: [name], male   DOB: May 18, 1950, 61 y.o.   MRN: [id number] Chief Complaint  Patient presents with  . Leg Injury    tibial plateau fracture, DOI 01/23/12    Fracture  Date of injury October 16.  Mechanism fall.  Pain described as sharp throbbing, stabbing.  Pain described as 10 out of 10. Despite Percocet.  Pain is constant.  Knee swelling.  Limited weightbearing.  Review of systems negative except for the pain. He is having from his injury.  History reviewed. No pertinent past medical history.  BP 110/60  Ht 6\' 2"  (1.88 m)  Wt 180 lb (81.647 kg)  BMI 23.11 kg/m2 Physical Exam(12) GENERAL: normal development   CDV: pulses are normal   Skin: normal  Lymph: nodes were not palpable/normal  Psychiatric: awake, alert and oriented  Neuro: normal sensation  MSK He is wheelchair bound since the injury 1 RIGHT knee swelling, tense effusion, limited weight-bearing. Ligament exam could not be assessed. 2 Motor exam. Muscle tone normal 3 Tenderness diffusely about the knee joint and proximal tibia. Alignment. Normal 4 Normal pulses and perfusion in the lower extremity.  Compartment is soft  V. Tibial plateau fracture extends down into the metaphysis. There is no displacement. There is a posterior coronal fracture line as well.  There is a chance joint effusion.  We aspirated approximately 80 cc of bloody fluid and injected 10 cc of Marcaine. We placed a range of motion brace locked a 20.  Minimal weight-bearing.  Followup x-ray 2 weeks

## 2012-01-24 NOTE — ED Provider Notes (Signed)
Medical screening examination/treatment/procedure(s) were performed by non-physician practitioner and as supervising physician I was immediately available for consultation/collaboration. Devoria Albe, MD, Armando Gang   Ward Givens, MD 01/24/12 0040

## 2012-02-01 ENCOUNTER — Encounter: Payer: Self-pay | Admitting: Orthopedic Surgery

## 2012-02-07 ENCOUNTER — Encounter: Payer: Self-pay | Admitting: Orthopedic Surgery

## 2012-02-07 ENCOUNTER — Ambulatory Visit (INDEPENDENT_AMBULATORY_CARE_PROVIDER_SITE_OTHER): Payer: 59

## 2012-02-07 ENCOUNTER — Ambulatory Visit (INDEPENDENT_AMBULATORY_CARE_PROVIDER_SITE_OTHER): Payer: 59 | Admitting: Orthopedic Surgery

## 2012-02-07 VITALS — BP 90/70 | Ht 74.0 in | Wt 180.0 lb

## 2012-02-07 DIAGNOSIS — S82143A Displaced bicondylar fracture of unspecified tibia, initial encounter for closed fracture: Secondary | ICD-10-CM

## 2012-02-07 DIAGNOSIS — S82209A Unspecified fracture of shaft of unspecified tibia, initial encounter for closed fracture: Secondary | ICD-10-CM

## 2012-02-07 DIAGNOSIS — S82109A Unspecified fracture of upper end of unspecified tibia, initial encounter for closed fracture: Secondary | ICD-10-CM

## 2012-02-07 MED ORDER — OXYCODONE-ACETAMINOPHEN 10-325 MG PO TABS: 1.0000 | ORAL_TABLET | ORAL | Status: DC | PRN

## 2012-02-07 NOTE — Progress Notes (Signed)
Patient ID: [name], male   DOB: 04-06-1951, 61 y.o.   MRN: [id number] Chief Complaint  Patient presents with  . Follow-up    follow up and xray right tib plateau, DOI 01/23/12    1. Tibia fracture  DG Tibia/Fibula Right  2. Tibial plateau fracture  oxyCODONE-acetaminophen (PERCOCET) 10-325 MG per tablet    He was taken today and it shows that the fracture is healing in appropriate position with no displacement.  The patient's neurovascular exam is intact. He has no signs of compartment syndrome.  He will continue nonweightbearing and followup  in 2 weeks for x-rays. Continue long-leg brace

## 2012-02-07 NOTE — Patient Instructions (Addendum)
No weight on leg wear brace all the time

## 2012-02-21 ENCOUNTER — Encounter: Payer: Self-pay | Admitting: Orthopedic Surgery

## 2012-02-21 ENCOUNTER — Ambulatory Visit (INDEPENDENT_AMBULATORY_CARE_PROVIDER_SITE_OTHER): Payer: 59

## 2012-02-21 ENCOUNTER — Ambulatory Visit (INDEPENDENT_AMBULATORY_CARE_PROVIDER_SITE_OTHER): Payer: 59 | Admitting: Orthopedic Surgery

## 2012-02-21 VITALS — Ht 74.0 in | Wt 180.0 lb

## 2012-02-21 DIAGNOSIS — S82143A Displaced bicondylar fracture of unspecified tibia, initial encounter for closed fracture: Secondary | ICD-10-CM

## 2012-02-21 DIAGNOSIS — S82109A Unspecified fracture of upper end of unspecified tibia, initial encounter for closed fracture: Secondary | ICD-10-CM

## 2012-02-21 MED ORDER — OXYCODONE-ACETAMINOPHEN 10-325 MG PO TABS: 1.0000 | ORAL_TABLET | ORAL | Status: DC | PRN

## 2012-02-21 NOTE — Progress Notes (Signed)
Patient ID: [name], male   DOB: 11/02/1950, 61 y.o.   MRN: [id number] Chief Complaint  Patient presents with  . Follow-up     recheck on right tibial plateau fracture (10/16, DOI)     Brace treatment  X-ray shows no displacement  Current flexion 90  I showed him exercises quad strengthening and knee flexion extension  Continue brace and continue minimal weightbearing to nonweightbearing continue crutches  Return 1 month for x-ray right tibial plateau

## 2012-02-21 NOTE — Patient Instructions (Addendum)
Continue non weight bearing  Continue bracing

## 2012-03-20 ENCOUNTER — Ambulatory Visit (INDEPENDENT_AMBULATORY_CARE_PROVIDER_SITE_OTHER): Payer: 59 | Admitting: Orthopedic Surgery

## 2012-03-20 ENCOUNTER — Encounter: Payer: Self-pay | Admitting: Orthopedic Surgery

## 2012-03-20 ENCOUNTER — Ambulatory Visit (INDEPENDENT_AMBULATORY_CARE_PROVIDER_SITE_OTHER): Payer: 59

## 2012-03-20 VITALS — Ht 74.0 in | Wt 180.0 lb

## 2012-03-20 DIAGNOSIS — S82143A Displaced bicondylar fracture of unspecified tibia, initial encounter for closed fracture: Secondary | ICD-10-CM

## 2012-03-20 DIAGNOSIS — S82109A Unspecified fracture of upper end of unspecified tibia, initial encounter for closed fracture: Secondary | ICD-10-CM

## 2012-03-20 NOTE — Progress Notes (Signed)
Patient ID: [name], male   DOB: Apr 26, 1950, 61 y.o.   MRN: [id number] Chief Complaint  Patient presents with  . Follow-up    4 week recheck on right tibial plateau fracture with xray. [oct 16th][    xrays show fracture healing well alignment is normal    OOW X 4 weeks   Ret jan 8th xray

## 2012-03-20 NOTE — Patient Instructions (Signed)
OOW TILL JAN 8TH  EXPECTED RTW JAN 9TH   WEAR NEW BRACE AND WALK ON LEG AS TOLERATED

## 2012-04-16 ENCOUNTER — Encounter: Payer: Self-pay | Admitting: Orthopedic Surgery

## 2012-04-16 ENCOUNTER — Ambulatory Visit (INDEPENDENT_AMBULATORY_CARE_PROVIDER_SITE_OTHER): Payer: 59 | Admitting: Orthopedic Surgery

## 2012-04-16 ENCOUNTER — Ambulatory Visit (INDEPENDENT_AMBULATORY_CARE_PROVIDER_SITE_OTHER): Payer: 59

## 2012-04-16 VITALS — BP 110/70 | Ht 74.0 in | Wt 180.0 lb

## 2012-04-16 DIAGNOSIS — S82143A Displaced bicondylar fracture of unspecified tibia, initial encounter for closed fracture: Secondary | ICD-10-CM

## 2012-04-16 DIAGNOSIS — S82109A Unspecified fracture of upper end of unspecified tibia, initial encounter for closed fracture: Secondary | ICD-10-CM

## 2012-04-16 NOTE — Patient Instructions (Addendum)
Start home therapy , do daily    Knee Exercises EXERCISES  Do 3 sets of 10 reps   RANGE OF MOTION(ROM) AND STRETCHING EXERCISES These exercises may help you when beginning to rehabilitate your injury. Your symptoms may resolve with or without further involvement from your physician, physical therapist or athletic trainer. While completing these exercises, remember:    Restoring tissue flexibility helps normal motion to return to the joints. This allows healthier, less painful movement and activity.   An effective stretch should be held for at least 30 seconds.   A stretch should never be painful. You should only feel a gentle lengthening or release in the stretched tissue.  STRETCH - Knee Extension, Prone  Lie on your stomach on a firm surface, such as a bed or countertop. Place your right / left knee and leg just beyond the edge of the surface. You may wish to place a towel under the far end of your right / left thigh for comfort.   Relax your leg muscles and allow gravity to straighten your knee. Your clinician may advise you to add an ankle weight if more resistance is helpful for you.  You should feel a stretch in the back of your right / left knee.  RANGE OF MOTION - Knee Flexion, Active  Lie on your back with both knees straight. (If this causes back discomfort, bend your opposite knee, placing your foot flat on the floor.)   Slowly slide your heel back toward your buttocks until you feel a gentle stretch in the front of your knee or thigh.     STRETCH - Quadriceps, Prone   Lie on your stomach on a firm surface, such as a bed or padded floor.   Bend your right / left knee and grasp your ankle. If you are unable to reach, your ankle or pant leg, use a belt around your foot to lengthen your reach.   Gently pull your heel toward your buttocks. Your knee should not slide out to the side. You should feel a stretch in the front of your thigh and/or knee.   STRETCH  Hamstrings,  Supine   Lie on your back. Loop a belt or towel over the ball of your right / left foot.   Straighten your right / left knee and slowly pull on the belt to raise your leg. Do not allow the right / left knee to bend. Keep your opposite leg flat on the floor.  Raise the leg until you feel a gentle stretch behind your right / left knee or thigh.  STRENGTHENING EXERCISES These exercises may help you when beginning to rehabilitate your injury. They may resolve your symptoms with or without further involvement from your physician, physical therapist or athletic trainer. While completing these exercises, remember:    Muscles can gain both the endurance and the strength needed for everyday activities through controlled exercises.   Complete these exercises as instructed by your physician, physical therapist or athletic trainer. Progress the resistance and repetitions only as guided.   You may experience muscle soreness or fatigue, but the pain or discomfort you are trying to eliminate should never worsen during these exercises. If this pain does worsen, stop and make certain you are following the directions exactly. If the pain is still present after adjustments, discontinue the exercise until you can discuss the trouble with your clinician.  STRENGTH - Quadriceps, Isometrics  Lie on your back with your right / left leg extended and  your opposite knee bent.   Gradually tense the muscles in the front of your right / left thigh. You should see either your knee cap slide up toward your hip or increased dimpling just above the knee. This motion will push the back of the knee down toward the floor/mat/bed on which you are lying.   STRENGTH - Quadriceps, Short Arcs   Lie on your back. Place a rolled  towel roll under your knee so that the knee slightly bends.   Raise only your lower leg by tightening the muscles in the front of your thigh. Do not allow your thigh to rise.     STRENGTH - Quadriceps,  Straight Leg Raises  Quality counts! Watch for signs that the quadriceps muscle is working to insure you are strengthening the correct muscles and not "cheating" by substituting with healthier muscles.  Lay on your back with your right / left leg extended and your opposite knee bent.   Tense the muscles in the front of your right / left thigh. You should see either your knee cap slide up or increased dimpling just above the knee. Your thigh may even quiver.  Tighten these muscles even more and raise your leg 4 to 6 inches off the floor.   STRENGTH - Hamstring, Curls  Lay on your stomach with your legs extended. (If you lay on a bed, your feet may hang over the edge.)   Tighten the muscles in the back of your thigh to bend your right / left knee up to 90 degrees. Keep your hips flat on the bed/floor.    STRENGTH  Quadriceps, Squats  Stand in a door frame so that your feet and knees are in line with the frame.   Use your hands for balance, not support, on the frame.   Slowly lower your weight, bending at the hips and knees. Keep your lower legs upright so that they are parallel with the door frame. Squat only within the range that does not increase your knee pain. Never let your hips drop below your knees.   Slowly return upright, pushing with your legs, not pulling with your hands.   STRENGTH - Quadriceps, Wall Slides  Follow guidelines for form closely. Increased knee pain often results from poorly placed feet or knees.  Lean against a smooth wall or door and walk your feet out 18-24 inches. Place your feet hip-width apart.   Slowly slide down the wall or door until your knees bend _30________ degrees.* Keep your knees over your heels, not your toes, and in line with your hips, not falling to either side.   * Your physician, physical therapist or athletic trainer will alter this angle based on your symptoms and progress. Document Released: 02/07/2005 Document Revised: 06/18/2011  Document Reviewed: 07/08/2008 [facility] Patient Information 2013 [place], [place].

## 2012-04-16 NOTE — Progress Notes (Signed)
Patient ID: [name], male   DOB: [birth year], 62 y.o.   MRN: [id number] Chief Complaint  Patient presents with  . Follow-up    recheck right knee from fracture 01/23/12    X-ray today shows fracture healing  Patient is in a hinged knee brace and using 1 crutch  He can go to a cane continue his brace he should start knee exercises at home followup with me in 6 weeks

## 2012-05-09 ENCOUNTER — Telehealth: Payer: Self-pay | Admitting: Orthopedic Surgery

## 2012-05-09 ENCOUNTER — Emergency Department (HOSPITAL_COMMUNITY): Payer: 59

## 2012-05-09 ENCOUNTER — Encounter (HOSPITAL_COMMUNITY): Payer: Self-pay | Admitting: *Deleted

## 2012-05-09 ENCOUNTER — Emergency Department (HOSPITAL_COMMUNITY)
Admission: EM | Admit: 2012-05-09 | Discharge: 2012-05-09 | Disposition: A | Discharge status: Home / Self Care | Payer: 59 | Attending: Emergency Medicine | Admitting: Emergency Medicine

## 2012-05-09 DIAGNOSIS — Y9301 Activity, walking, marching and hiking: Secondary | ICD-10-CM | POA: Insufficient documentation

## 2012-05-09 DIAGNOSIS — S82143A Displaced bicondylar fracture of unspecified tibia, initial encounter for closed fracture: Secondary | ICD-10-CM

## 2012-05-09 DIAGNOSIS — W010XXA Fall on same level from slipping, tripping and stumbling without subsequent striking against object, initial encounter: Secondary | ICD-10-CM | POA: Insufficient documentation

## 2012-05-09 DIAGNOSIS — S82109A Unspecified fracture of upper end of unspecified tibia, initial encounter for closed fracture: Secondary | ICD-10-CM | POA: Insufficient documentation

## 2012-05-09 DIAGNOSIS — Y9289 Other specified places as the place of occurrence of the external cause: Secondary | ICD-10-CM | POA: Insufficient documentation

## 2012-05-09 DIAGNOSIS — Z87891 Personal history of nicotine dependence: Secondary | ICD-10-CM | POA: Insufficient documentation

## 2012-05-09 HISTORY — DX: Reserved for concepts with insufficient information to code with codable children: IMO0002

## 2012-05-09 MED ORDER — OXYCODONE-ACETAMINOPHEN 5-325 MG PO TABS: 1.0000 | ORAL_TABLET | ORAL | Status: AC | PRN

## 2012-05-09 NOTE — Telephone Encounter (Signed)
[name] called this morning asking to be seen [facility], said he fell on ice yesterday and is afraid he had damaged his knee.  Told him you are in surgery today and Monday and he needs to go to the ER for an XR and work-up, and if needed we can see him next week.

## 2012-05-09 NOTE — ED Notes (Signed)
Pt in Radiology 

## 2012-05-09 NOTE — ED Provider Notes (Signed)
Medical screening examination/treatment/procedure(s) were performed by non-physician practitioner and as supervising physician I was immediately available for consultation/collaboration. Colson Barco, MD, FACEP   Marielle Mantione L Zakaiya Lares, MD 05/09/12 1642 

## 2012-05-09 NOTE — ED Provider Notes (Signed)
History     CSN: [id number]  Arrival date & time 05/09/12  1013   First MD Initiated Contact with Patient 05/09/12 1049      Chief Complaint  Patient presents with  . Knee Pain    (Consider location/radiation/quality/duration/timing/severity/associated sxs/prior treatment) HPI Comments: [name] is a 62 y.o. Male presenting with increased pain after falling on his recently fractured right knee yesterday afternoon.  He describes walking to his mailbox and slipped on ice landing directly on the right knee while bent in a 90 position.  He has a known healing right tibial plateau fracture which is followed by Dr.  Romeo Apple and when he called his office this morning to advise him of this new injury, he was directed to the ED for x-rays and further management.  He has taken oxycodone which has relieved his discomfort, last dose taken yesterday evening, stating he hasn't fully needed any pain medicines today.  He does have slightly increased swelling of the knee joint but no worse pain today or weakness about the joint.    The history is provided by the patient.    Past Medical History  Diagnosis Date  . Knee fracture     Past Surgical History  Procedure Date  . Prostatectomy 2005    [facility]. Jerre Simon  . Colonoscopy 11/28/2011    Procedure: COLONOSCOPY;  Surgeon: Malissa Hippo, MD;  Location: [facility];  Service: Endoscopy;  Laterality: N/A;  930    Family History  Problem Relation Age of Onset  . Colon cancer Brother   . Asthma    . Diabetes      History  Substance Use Topics  . Smoking status: Former Smoker -- 1.0 packs/day for 45 years    Types: Cigarettes  . Smokeless tobacco: Not on file  . Alcohol Use: 12.0 oz/week    20 Cans of beer per week     Comment: 20 cans of beer week and little less than a half a pint per week      Review of Systems  Musculoskeletal: Positive for joint swelling and arthralgias.  Skin: Negative for wound.   Neurological: Negative for weakness and numbness.    Allergies  Codeine  Home Medications   Current Outpatient Rx  Name  Route  Sig  Dispense  Refill  . OXYCODONE-ACETAMINOPHEN 5-325 MG PO TABS   Oral   Take 1 tablet by mouth every 4 (four) hours as needed for pain.   30 tablet   0     BP 129/84  Pulse 68  Temp 97.4 F (36.3 C)  Resp 18  SpO2 100%  Physical Exam  Constitutional: He appears well-developed and well-nourished.  HENT:  Head: Atraumatic.  Neck: Normal range of motion.  Cardiovascular:       Pulses equal bilaterally  Musculoskeletal: He exhibits tenderness.       Right knee: He exhibits swelling and effusion. He exhibits no ecchymosis, no erythema, no LCL laxity and no MCL laxity. tenderness found. Medial joint line and lateral joint line tenderness noted.  Neurological: He is alert. He has normal strength. He displays normal reflexes. No sensory deficit.       Equal strength  Skin: Skin is warm and dry.  Psychiatric: He has a normal mood and affect.    ED Course  Procedures (including critical care time)  Labs Reviewed - No data to display Dg Knee Complete 4 Views Right  05/09/2012  *RADIOLOGY REPORT*  Clinical  Data: Hip pain and swelling  RIGHT KNEE - COMPLETE 4+ VIEW  Comparison: Multiple examinations 2013.  03/15/2008.  Findings: Healing proximal tibial fracture remains evident. Healing is not complete.  There has been some depression of the medial tibial plateau with widening of the medial joint space. There is a moderate sized joint effusion.  This may require evaluation by a surgeon for possible ORIF.  IMPRESSION: Healing fracture of the proximal tibia.  Union does not appear complete at this time.  There appears be some depression of the medial tibial plateau compared to previous studies with widening of the medial joint space.  Moderate joint effusion.  See above discussion.   Original Report Authenticated By: Paulina Fusi, M.D.      1. Tibial  plateau fracture       MDM  X-rays reviewed revealing moderate sized joint effusion and persistent tibial plateau fracture with depression of the medial tibial plateau which is new compared to previous studies.  Multiple attempts to discuss with Dr. Romeo Apple was attempted.  Patient was told Dr. Romeo Apple was in surgery today when he called their office, no response with call to the OR, overhead page and also call placed to office as well but no one currently available at the office was closed.  Patient was encouraged to maintain complete nonweightbearing he was placed in a full knee immobilizer and given crutches although he hadn't crutches he only presented with one today and he was strongly encouraged that he should not bear weight.  Ice and elevation, Percocet prescribed patient to stop by Dr. Mort Sawyers office this afternoon to obtain followup care.       Burgess Amor, [place] 05/09/12 1614

## 2012-05-09 NOTE — ED Notes (Addendum)
Pt was walking to his mailbox yesterday, slipped on ice, landing in driveway, c/o pain to right knee area, is able to walk on right knee with use of crutch. Pt did take one of his pain medications prior to leaving home for er, states that the pain has improved since earlier.

## 2012-05-12 ENCOUNTER — Encounter: Payer: Self-pay | Admitting: Orthopedic Surgery

## 2012-05-12 ENCOUNTER — Ambulatory Visit (INDEPENDENT_AMBULATORY_CARE_PROVIDER_SITE_OTHER): Payer: 59 | Admitting: Orthopedic Surgery

## 2012-05-12 VITALS — BP 112/70 | Ht 74.0 in | Wt 180.0 lb

## 2012-05-12 DIAGNOSIS — S8000XA Contusion of unspecified knee, initial encounter: Secondary | ICD-10-CM | POA: Insufficient documentation

## 2012-05-12 NOTE — Progress Notes (Signed)
Patient ID: [name], male   DOB: April 04, 1951, 62 y.o.   MRN: [id number] Chief Complaint  Patient presents with  . Knee Pain    Right knee pain d/t injury 05/08/12    Post right medial tibial plateau fracture treated nonoperatively with bracing  He did well and then he slipped on the ice on January 30 x-rays were done the ER physician thought there was a change is seen the x-rays there is no change of any significance and clinically his pain is 2/10 he is doing well not requiring much of any pain medication and not really having any significant discomfort  His review of systems he has little knee swelling. He is able to wear brace use and a long-leg straight brace with crutches  His medications are as recorded  His exam shows well-developed well-nourished male grooming and hygiene are intact he is ambulating with a set of crutches and long-leg splint. He has very mild tenderness over his medial tibial plateau his range of motion is 125 he has good quadriceps tone and his knee is stable to varus valgus stress and in the anteroposterior plane  His x-ray as stated  Impression knee contusion status post previous medial tibial plateau fracture with metaphyseal diaphyseal extension  Recommend bracing with a hinged brace return in 3 weeks weightbearing as tolerated

## 2012-05-12 NOTE — Patient Instructions (Addendum)
Brace for 2 weeks   Hold off on any therapy

## 2012-05-28 ENCOUNTER — Ambulatory Visit: Payer: 59 | Admitting: Orthopedic Surgery

## 2012-06-03 ENCOUNTER — Encounter: Payer: Self-pay | Admitting: Orthopedic Surgery

## 2012-06-03 ENCOUNTER — Ambulatory Visit (INDEPENDENT_AMBULATORY_CARE_PROVIDER_SITE_OTHER): Payer: 59 | Admitting: Orthopedic Surgery

## 2012-06-03 VITALS — BP 110/70 | Ht 74.0 in | Wt 180.0 lb

## 2012-06-03 DIAGNOSIS — S8001XD Contusion of right knee, subsequent encounter: Secondary | ICD-10-CM

## 2012-06-03 DIAGNOSIS — S8290XD Unspecified fracture of unspecified lower leg, subsequent encounter for closed fracture with routine healing: Secondary | ICD-10-CM

## 2012-06-03 DIAGNOSIS — Z5189 Encounter for other specified aftercare: Secondary | ICD-10-CM

## 2012-06-03 DIAGNOSIS — S82141D Displaced bicondylar fracture of right tibia, subsequent encounter for closed fracture with routine healing: Secondary | ICD-10-CM

## 2012-06-03 NOTE — Patient Instructions (Addendum)
Add glucosamine and chonroitin cartilage supplements   Chondroitin; Glucosamine tablets or capsules What is this medicine? CHONDROITIN; GLUCOSAMINE is a dietary supplement. It is promoted for its ability to reduce the symptoms of osteoarthritis by maintaining healthy joint cartilage. The FDA has not approved this supplement for any medical use. This medicine may be used for other purposes; ask your health care provider or pharmacist if you have questions. What should I tell my health care provider before I take this medicine? They need to know if you have any of these conditions: -diabetes -heart disease -kidney disease -liver disease -stomach or intestinal problems -an unusual or allergic reaction to chondroitin, glucosamine, sulfonamides, other medicines, foods, dyes, or preservatives -pregnant or trying to get pregnant -breast-feeding How should I use this medicine? Take this supplement by mouth with a glass of water. Follow the directions on the package labeling, or take as directed by your health care professional. Take your medicine at regular intervals. Do not take this supplement more often than directed. Talk to your pediatrician regarding the use of this medicine in children. Special care may be needed. Overdosage: If you think you have taken too much of this medicine contact a poison control center or emergency room at once. NOTE: This medicine is only for you. Do not share this medicine with others. What if I miss a dose? If you miss a dose, take it as soon as you can. If it is almost time for your next dose, take only that dose. Do not take double or extra doses. What may interact with this medicine? Check with your doctor or healthcare professional if you are taking any of the following medications: -warfarin This list may not describe all possible interactions. Give your health care provider a list of all the medicines, herbs, non-prescription drugs, or dietary supplements  you use. Also tell them if you smoke, drink alcohol, or use illegal drugs. Some items may interact with your medicine. What should I watch for while using this medicine? Tell your doctor or healthcare professional if your symptoms do not start to get better or if they get worse. This supplement may take several weeks to work for you. If you are scheduled for any medical or dental procedure, tell your healthcare provider that you are taking this supplement. You may need to stop taking this supplement before the procedure. This supplement may affect blood sugar levels. If you have diabetes, check with your doctor or health care professional before you change your diet or the dose of your diabetic medicine. Herbal or dietary supplements are not regulated like medicines. Rigid quality control standards are not required for dietary supplements. The purity and strength of these products can vary. The safety and effect of this dietary supplement for a certain disease or illness is not well known. This product is not intended to diagnose, treat, cure or prevent any disease. The Food and Drug Administration suggests the following to help consumers protect themselves: -Always read product labels and follow directions. -Natural does not mean a product is safe for humans to take. -Look for products that include USP after the ingredient name. This means that the manufacturer followed the standards of the [place] Pharmacopoeia. -Supplements made or sold by a nationally known food or drug company are more likely to be made under tight controls. You can write to the company for more information about how the product was made. What side effects may I notice from receiving this medicine? Side effects that you should  report to your doctor or health care professional as soon as possible: -allergic reactions like skin rash, itching or hives, swelling of the face, lips, or tongue -breathing  problems -constipation -diarrhea -difficulty sleeping -drowsiness -hair loss -headache -loss of appetite -stomach pain -swelling of the ankles or feet Side effects that usually do not require medical attention (report to your doctor or health care professional if they continue or are bothersome): -gas -nausea -upset stomach This list may not describe all possible side effects. Call your doctor for medical advice about side effects. You may report side effects to FDA at 1-800-FDA-1088. Where should I keep my medicine? Keep out of the reach of children. Store at room temperature between 15 and 30 degrees C (59 and 86 degrees F) or as directed on the package label. Protect from moisture. Throw away any unused supplement after the expiration date. NOTE: This sheet is a summary. It may not cover all possible information. If you have questions about this medicine, talk to your doctor, pharmacist, or health care provider.  2012, Elsevier/Gold Standard. (09/25/2005 6:23:00 PM)Glucosamine The body uses glucosamine to build cartilage, the smooth covering of bone ends. Cartilage allows joints to move easily, without pain. Osteoarthritis is a painful condition, in which this cartilage becomes worn down. Many glucosamine supplements combine glucosamine with chondrotin sulfate. Chondrotin is produced naturally by the body, to lubricate joint surfaces and to prevent the breakdown of cartilage. However, the body does not effectively absorb ingested chondrotin, so supplementing with it may have no benefit. In several short-term studies, glucosamine has been found as effective as ibuprofen, in reducing osteoarthritis pain.  WHY ATHLETES USE IT  Athletes use glucosamine supplements in an effort to prevent breakdown of cartilage, ligaments, and tendons.  ADVERSE EFFECTS  Glucosamine appears to be well tolerated by the body. Infrequent reports of side effects, include:   Stomach pain.  Rapid heart rate  (tachycardia).  Swelling of the feet (edema).  Headache.  Skin rash.  Drowsiness.  Nausea. PHARMACOLOGY  Glucosamine is often advised as a 500-mg pill, taken three times a day. No known interactions with other drugs exist. Glucosamine supplementation does not seem to benefit everyone. If no benefits are seen within 6 to 12 weeks, the drug is not likely to be of benefit. A 3-week supply of glucosamine costs about $25. It is well absorbed, when taken by mouth.  PREVENTION Although glucosamine has been shown to help arthritis, and has relatively low risk of side effects, the Arthritis Foundation does not recommend glucosamine supplementation. Currently, no long-term studies have been performed. Almost all studies have involved small numbers of patients. Good studies looking at safety and best dosage have not been performed. Glucosamine has not been shown to protect joints from wear, as it is often advertised. It has not been shown to effect tendons or ligaments. It has not been shown to reverse arthritis.  Document Released: 03/26/2005 Document Revised: 06/18/2011 Document Reviewed: 07/08/2008 [facility] Patient Information 2013 [place], [place].  RTW  by April 7th

## 2012-06-03 NOTE — Progress Notes (Signed)
Patient ID: [name], male   DOB: 07-21-1950, 62 y.o.   MRN: [id number] Chief Complaint  Patient presents with  . Follow-up    right knee tibial plateau fx DOI 05/08/12    Post fall after previous right tibial plateau fracture  Patient complains of a persistent limp actually since his original injury  He will have to return to work on April 7  His leg is somewhat weak  Review of systems he denies any numbness or tingling.  BP 110/70  Ht 6\' 2"  (1.88 m)  Wt 180 lb (81.647 kg)  BMI 23.1 kg/m2 He is awake alert and oriented x3 his mood and affect is normal he ambulates with a slight limp favoring his right leg. He has no tenderness laterally or superiorly she does have some pain with patellofemoral compression test and some medial tibial plate pain. His range of motion 125 and he feels stable strength is normal male muscle testing his skin is intact  Recommend physical therapy to address weakness and balance issues   Six-week followup

## 2012-06-16 ENCOUNTER — Ambulatory Visit (HOSPITAL_COMMUNITY)
Admission: RE | Admit: 2012-06-16 | Discharge: 2012-06-16 | Disposition: A | Discharge status: Home / Self Care | Payer: 59 | Source: Ambulatory Visit | Attending: Orthopedic Surgery | Admitting: Orthopedic Surgery

## 2012-06-16 DIAGNOSIS — M6281 Muscle weakness (generalized): Secondary | ICD-10-CM | POA: Insufficient documentation

## 2012-06-16 DIAGNOSIS — R279 Unspecified lack of coordination: Secondary | ICD-10-CM | POA: Insufficient documentation

## 2012-06-16 DIAGNOSIS — IMO0001 Reserved for inherently not codable concepts without codable children: Secondary | ICD-10-CM | POA: Insufficient documentation

## 2012-06-16 DIAGNOSIS — R2689 Other abnormalities of gait and mobility: Secondary | ICD-10-CM | POA: Insufficient documentation

## 2012-06-16 DIAGNOSIS — R262 Difficulty in walking, not elsewhere classified: Secondary | ICD-10-CM | POA: Insufficient documentation

## 2012-06-16 DIAGNOSIS — R29898 Other symptoms and signs involving the musculoskeletal system: Secondary | ICD-10-CM | POA: Insufficient documentation

## 2012-06-16 DIAGNOSIS — M79609 Pain in unspecified limb: Secondary | ICD-10-CM | POA: Insufficient documentation

## 2012-06-16 NOTE — Evaluation (Signed)
Physical Therapy Evaluation  Patient Details  Name: [name] MRN: [id number] Date of Birth: [birth year] Charge:  Eval; there ex x 10 Today's Date: 06/16/2012 Time: 0800-0845 PT Time Calculation (min): 45 min  Visit#: 1 of 16  Re-eval: 07/16/12 Assessment Diagnosis: S/P tibial plateau fx Next MD Visit: 07/31/2012 Prior Therapy: HH  Authorization: cigna-preauthoriztion needed sent in.  Past Medical History:  Past Medical History  Diagnosis Date  . Knee fracture    Past Surgical History:  Past Surgical History  Procedure Laterality Date  . Prostatectomy  2005    [facility]. Jerre Simon  . Colonoscopy  11/28/2011    Procedure: COLONOSCOPY;  Surgeon: Malissa Hippo, MD;  Location: [facility];  Service: Endoscopy;  Laterality: N/A;  930    Subjective Symptoms/Limitations Symptoms: Mr. Keano states that he injued his knee on 10/16 fracturing his tibial plateau.  He did well with conservative treatment in a brace when he slipped on the ice on 05/08/2012.  The patient states at this time the most pain is under his knee cap.  He is having trouble squatting, going up steps and making sharp turns.  He had HH therapy and this was discontinued  in January.  H  How long can you sit comfortably?: The patient states that sitting is alright but after sitting for 45 minutes when he gets up his knee is very stiff.  How long can you stand comfortably?: The patient is able to stand for an hour. How long can you walk comfortably?: He is no longer using his brace and he is able to walk for about a mile taking about 20 minutes. 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  Cognition/Observation Observation/Other Assessments Observations: Pt has a  significant limp  Sensation/Coordination/Flexibility/Functional Tests Functional Tests Functional Tests: LEFS 35/80  Assessment RLE AROM (degrees) Right Knee Extension: 0 Right Knee Flexion: 130 RLE Strength Right Hip Flexion: 4/5 Right Hip Extension: 5/5 Right Hip ABduction:  (4-/5) Right Hip ADduction: 5/5 Right Knee Flexion:  (4-/5) Right Knee Extension:  (4-/5) Right Ankle Dorsiflexion: 3+/5 Right Ankle Plantar Flexion: 3-/5  Exercise/Treatments Mobility/Balance  Static Standing Balance Single Leg Stance - Right Leg: 2 Single Leg Stance - Left Leg: 60    Standing Heel Raises: 10 reps SLS: R 2 sec. x3 Seated Long Arc Quad: 10 reps;Weights Long Arc Quad Weight: 5 lbs. Supine Straight Leg Raises: 10 reps Sidelying Hip ABduction: 10 reps Prone  Hamstring Curl: 10 reps;Limitations Hamstring Curl Limitations: 5     Physical Therapy Assessment and Plan PT Assessment and Plan Clinical Impression Statement: Pt s/p tibial plateau fx with resulting decreased strength and balance.  Pt will benefit from skilled therapy to address deficits and return pt to prior functional level. Pt will benefit from skilled therapeutic intervention in order to improve on the following deficits: Decreased balance;Decreased activity tolerance;Decreased strength;Difficulty walking Rehab Potential: Good PT Frequency: Min 3X/week PT Duration: 6 weeks PT Treatment/Interventions: Stair training;Gait training;Therapeutic exercise;Therapeutic activities;Balance training PT Plan: begin rocker board, functional squats, step up, lateral step up and step down; progress to wall slides, chair poses, warrior poses...    Goals Home Exercise Program Pt will Perform Home Exercise Program: Independently PT Short Term Goals Time to Complete Short Term Goals: 2 weeks PT Short Term Goal 1:  Pt strength to be increased 1/2 grade to allow pt to walk up inclines with ease PT Long Term Goals Time to Complete  Long Term Goals: 4 weeks PT Long Term Goal 1: Pt strength to be increased one grade to allow pt to go up and down steps reciprocally without difficulty PT Long Term Goal 2: Pt to be able to walk without a limp. Long Term Goal 3: Pt SLS to be 30 seconds to allow pt to climb up ladders safely. Long Term Goal 4: LEFS increased by 15 PT Long Term Goal 5: Pt able to run to be able to play with his  granddaughter  Problem List Patient Active Problem List  Diagnosis  . Tibial plateau fracture  . Knee contusion  . Weakness of right leg  . Balance problem  . Difficulty walking    General Behavior During Session: [facility] for tasks performed Cognition: [facility] for tasks performed PT Plan of Care PT Home Exercise Plan: given  GP    Quana Chamberlain,CINDY 06/16/2012, 10:14 AM  Physician Documentation Your signature is required to indicate approval of the treatment plan as stated above.  Please sign and either send electronically or make a copy of this report for your files and return this physician signed original.   Please mark one 1.__approve of plan  2. ___approve of plan with the following conditions.   ______________________________                                                          _____________________ Physician Signature                                                                                                             Date

## 2012-06-18 ENCOUNTER — Ambulatory Visit (HOSPITAL_COMMUNITY)
Admission: RE | Admit: 2012-06-18 | Discharge: 2012-06-18 | Disposition: A | Discharge status: Home / Self Care | Payer: 59 | Source: Ambulatory Visit | Attending: Orthopedic Surgery | Admitting: Orthopedic Surgery

## 2012-06-18 NOTE — Progress Notes (Signed)
Physical Therapy Treatment Patient Details  Name: [name] MRN: [id number] Date of Birth: Aug 28, 1950  Today's Date: 06/18/2012 Time: 0801-0841 PT Time Calculation (min): 40 min  Visit#: 2 of 16  Re-eval: 07/16/12 Charges: Therex x 30' Gait x 8'  Authorization: cigna-preauthoriztion needed sent in.    Subjective: Symptoms/Limitations Symptoms: "I'm ready to get rid of this limp." Pain Assessment Currently in Pain?: No/denies   Exercise/Treatments Aerobic Tread Mill: Gt training .50 cycle leg length 90 Machines for Strengthening Cybex Knee Extension: 1pl x 10 RLE only Cybex Knee Flexion: 3pl x 10 RLE only Standing Heel Raises: 10 reps;Limitations Heel Raises Limitations: Toe raises x 10 Lateral Step Up: 10 reps;Step Height: 4";Hand Hold: 2;Right Forward Step Up: 10 reps;Step Height: 4";Right;Hand Hold: 2 Functional Squat: 10 reps Rocker Board: 2 minutes SLS: R: 10" max of 3 Supine Straight Leg Raises: 10 reps   Physical Therapy Assessment and Plan PT Assessment and Plan Clinical Impression Statement: Pt tolerates therex well without complaint of increased pain. Pt requires multimodal cueing with functional squats and lateral step ups for correct form. Began gait training on treadmill to improve stride length and equalize stance time. PT Plan: Begin forward step down next session; progress to wall slides, chair poses, warrior poses.     Problem List Patient Active Problem List  Diagnosis  . Tibial plateau fracture  . Knee contusion  . Weakness of right leg  . Balance problem  . Difficulty walking    General Behavior During Session: [employer] for tasks performed Cognition: [facility] for tasks performed   Seth Bake, PTA 06/18/2012, 8:45 AM

## 2012-06-20 ENCOUNTER — Ambulatory Visit (HOSPITAL_COMMUNITY)
Admission: RE | Admit: 2012-06-20 | Discharge: 2012-06-20 | Disposition: A | Discharge status: Home / Self Care | Payer: 59 | Source: Ambulatory Visit | Attending: Orthopedic Surgery | Admitting: Orthopedic Surgery

## 2012-06-20 DIAGNOSIS — R262 Difficulty in walking, not elsewhere classified: Secondary | ICD-10-CM

## 2012-06-20 DIAGNOSIS — R29898 Other symptoms and signs involving the musculoskeletal system: Secondary | ICD-10-CM

## 2012-06-20 DIAGNOSIS — R2689 Other abnormalities of gait and mobility: Secondary | ICD-10-CM

## 2012-06-20 NOTE — Progress Notes (Signed)
Physical Therapy Treatment Patient Details  Name: [name] MRN: [id number] Date of Birth: 06-03-1950  Today's Date: 06/20/2012 Time: 0850-0930 PT Time Calculation (min): 40 min Charge: therex 30, gait 8'  Visit#: 3 of 16  Re-eval: 07/16/12    Authorization: cigna-preauthoriztion needed sent in.   Subjective: Symptoms/Limitations Symptoms: I am pain free today, want to get stronger and get rid of this limp. Pain Assessment Currently in Pain?: No/denies  Objective :  Exercise/Treatments Aerobic Tread Mill: Gt training .65 cycle leg length 90 x 8' Machines for Strengthening Cybex Knee Extension: 1pl x 15 RLE only Cybex Knee Flexion: 3.5pl x 15 RLE only Standing Heel Raises: Limitations Heel Raises Limitations: heel and toe walking 1RT Lateral Step Up: 15 reps;Right;Hand Hold: 0;Step Height: 6" Forward Step Up: Right;15 reps;Hand Hold: 0;Step Height: 6" SLS: R 15" max of 3 SLS with Vectors: 3x 5" with intermittene HHA Other Standing Knee Exercises: chair pose 10x 15" Other Standing Knee Exercises: tree pose 3x 10" with min A for balance      Physical Therapy Assessment and Plan PT Assessment and Plan Clinical Impression Statement: Progressed therex for LE functional strengthening and balance with multimodal cueing for correct form for maximum musculature strengthening.  Pt limited by fatigue with new activity, no c/o pain with any activity.  Continued gait training on treadmill to improve stride length and equalize stance phase to reduce limping PT Plan: Continue with current POC, progress to wall slides amd warrior poses.    Goals    Problem List Patient Active Problem List  Diagnosis  . Tibial plateau fracture  . Knee contusion  . Weakness of right leg  . Balance problem  . Difficulty walking    PT - End of Session Activity Tolerance: Patient tolerated treatment well General Behavior During Session: [facility] for tasks performed Cognition: [facility] for tasks  performed  GP    Juel Burrow 06/20/2012, 10:58 AM

## 2012-06-23 ENCOUNTER — Ambulatory Visit (HOSPITAL_COMMUNITY)
Admission: RE | Admit: 2012-06-23 | Discharge: 2012-06-23 | Disposition: A | Discharge status: Home / Self Care | Payer: 59 | Source: Ambulatory Visit | Attending: Orthopedic Surgery | Admitting: Orthopedic Surgery

## 2012-06-23 NOTE — Progress Notes (Signed)
Physical Therapy Treatment Patient Details  Name: [name] MRN: [id number] Date of Birth: 10-07-1950  Today's Date: 06/23/2012 Time: 4098-1191 PT Time Calculation (min): 40 min  Visit#: 4 of 16  Re-eval: 07/16/12 Charges: Therex x 30' Gait x 8'  Authorization: c[employer]-preauthoriztion needed sent in.    Subjective: Symptoms/Limitations Symptoms: Pt is pain free. Pt states that his limp is getting better but still needs work. Pain Assessment Currently in Pain?: No/denies   Exercise/Treatments Aerobic Tread Mill: Gt training .70 cycle leg length 90 x 8' Machines for Strengthening Cybex Knee Extension: 1pl x 15 RLE only Cybex Knee Flexion: 3.5pl x 15 RLE only Standing Heel Raises: Limitations Heel Raises Limitations: heel and toe walking 1RT Lateral Step Up: 15 reps;Right;Hand Hold: 0;Step Height: 6" Forward Step Up: 15 reps;Hand Hold: 0;Step Height: 6";Right Rocker Board: 2 minutes SLS: R 29" max of 3 SLS with Vectors: 3x 5" with intermittentHHA Other Standing Knee Exercises: chair pose 10x 15" Other Standing Knee Exercises: tree pose 3x 15" with min A for balance  Physical Therapy Assessment and Plan PT Assessment and Plan Clinical Impression Statement: Pt completes therex without complaint of pain. Pt's LE stability appears to be improving with balance activities. Increased weight with hamstring cybex machine without difficulty. Gait quality is improving but decreases with fatigue. PT Plan: Continue with current POC, progress to wall slides amd warrior poses.     Problem List Patient Active Problem List  Diagnosis  . Tibial plateau fracture  . Knee contusion  . Weakness of right leg  . Balance problem  . Difficulty walking    PT - End of Session Activity Tolerance: Patient tolerated treatment well General Behavior During Session: [name] for tasks performed Cognition: [name] for tasks performed  Seth Bake, PTA 06/23/2012, 8:47 AM

## 2012-06-25 ENCOUNTER — Ambulatory Visit (HOSPITAL_COMMUNITY)
Admission: RE | Admit: 2012-06-25 | Discharge: 2012-06-25 | Disposition: A | Discharge status: Home / Self Care | Payer: 59 | Source: Ambulatory Visit | Attending: Orthopedic Surgery | Admitting: Orthopedic Surgery

## 2012-06-25 NOTE — Progress Notes (Signed)
Physical Therapy Treatment Patient Details  Name: [name] MRN: [id number] Date of Birth: December 31, 1950  Today's Date: 06/25/2012 Time: 4098-1191 PT Time Calculation (min): 39 min  Visit#: 5 of 16  Re-eval: 07/16/12 Charges: Therex x 30' Gait x 8'  Authorization: [employer]-preauthoriztion needed sent in.   Subjective: Symptoms/Limitations Symptoms: Pt states that pain is not an issue. The only time he has discomfort is when he sits for a long period of time. Pain Assessment Currently in Pain?: No/denies    Exercise/Treatments Aerobic Tread Mill: Gt training .70 cycle leg length 90 x 8' Machines for Strengthening Cybex Knee Extension: 1pl x 15 RLE only Cybex Knee Flexion: 5.5pl x 15 RLE only Standing Heel Raises Limitations: heel and toe walking 2RT Lateral Step Up: 20 reps;Hand Hold: 0;Step Height: 6";Right Forward Step Up: 20 reps;Hand Hold: 0;Step Height: 6";Right Rocker Board: 2 minutes Other Standing Knee Exercises: chair pose 10x 15" ; Warrior I 2x30" Other Standing Knee Exercises: tree pose 3x 15" with min A for balance  Physical Therapy Assessment and Plan PT Assessment and Plan Clinical Impression Statement: PT displays improved control with singe leg activities. Began wall sqauts with good form and visable quad fatigue. Pt displays more equalized stance time with gait training. Pt appears to be progressing well over all and is without complaint throhgotu session, PT Plan: Continue to progress strength and improve gait mechanics per PT POC.     Problem List Patient Active Problem List  Diagnosis  . Tibial plateau fracture  . Knee contusion  . Weakness of right leg  . Balance problem  . Difficulty walking    PT - End of Session Activity Tolerance: Patient tolerated treatment well General Behavior During Session: [facility] for tasks performed Cognition: [facility] for tasks performed  Seth Bake, PTA 06/25/2012, 9:10 AM

## 2012-06-27 ENCOUNTER — Ambulatory Visit (HOSPITAL_COMMUNITY)
Admission: RE | Admit: 2012-06-27 | Discharge: 2012-06-27 | Disposition: A | Discharge status: Home / Self Care | Payer: 59 | Source: Ambulatory Visit | Attending: Family Medicine | Admitting: Family Medicine

## 2012-06-27 DIAGNOSIS — R29898 Other symptoms and signs involving the musculoskeletal system: Secondary | ICD-10-CM

## 2012-06-27 DIAGNOSIS — R2689 Other abnormalities of gait and mobility: Secondary | ICD-10-CM

## 2012-06-27 DIAGNOSIS — R262 Difficulty in walking, not elsewhere classified: Secondary | ICD-10-CM

## 2012-06-27 NOTE — Progress Notes (Signed)
Physical Therapy Treatment Patient Details  Name: [name] MRN: [id number] Date of Birth: 26-Aug-1950  Today's Date: 06/27/2012 Time: 0802-0845 PT Time Calculation (min): 43 min Charge ; therex 35', gait 8'  Visit#: 6 of 16  Re-eval: 07/16/12    Authorization: [employer]-preauthoriztion needed sent in.  Authorization Time Period:    Authorization Visit#:   of     Subjective: Symptoms/Limitations Symptoms: Pt stated knee really tight today, no real pain.  Pt reported walking for 2 miles yesterday Pain Assessment Currently in Pain?: No/denies  Objective:  Exercise/Treatments Stretches Active Hamstring Stretch: 1 rep;60 seconds;2 reps;30 seconds;Limitations Active Hamstring Stretch Limitations: 1 x 60"; 2x30 supine with rope Quad Stretch: 2 reps;30 seconds Gastroc Stretch: 3 reps;30 seconds (slant board) Aerobic Tread Mill: Gt training .70 cycle leg length 90 x 8' with UE movements to normalize gait Machines for Strengthening Cybex Knee Extension: 1pl x 15 RLE only Cybex Knee Flexion: 5.5pl x 15 RLE only Standing Heel Raises Limitations: heel and toe walking 2RT Lateral Step Up: 20 reps;Hand Hold: 0;Step Height: 6";Right Forward Step Up: 20 reps;Hand Hold: 0;Step Height: 6";Right Step Down: Right;20 reps;Hand Hold: 0;Step Height: 6" Rocker Board: 2 minutes SLS with Vectors: 3x 5" with intermittentHHA Other Standing Knee Exercises: chair pose 10x 15"     Physical Therapy Assessment and Plan PT Assessment and Plan Clinical Impression Statement: Pt instructed stretches initially this session to reduce tightness.  Pt improving gait mechanics, included UE movements to normalize gait pattern with minimal antalgic gait.  Pt limited by fatigue at end of session requiring min assistance with balance activities and cueing for posture.   Visible musculature fatigue, no reports of pain. PT Plan: Continue to progress balance, strength and improve gait mechanics per PT POC.    Goals     Problem List Patient Active Problem List  Diagnosis  . Tibial plateau fracture  . Knee contusion  . Weakness of right leg  . Balance problem  . Difficulty walking    PT - End of Session Activity Tolerance: Patient tolerated treatment well;Patient limited by fatigue General Behavior During Session: [facility] for tasks performed Cognition: [facility] for tasks performed  GP    Juel Burrow 06/27/2012, 9:00 AM

## 2012-06-30 ENCOUNTER — Ambulatory Visit (HOSPITAL_COMMUNITY)
Admission: RE | Admit: 2012-06-30 | Discharge: 2012-06-30 | Disposition: A | Discharge status: Home / Self Care | Payer: 59 | Source: Ambulatory Visit | Attending: Orthopedic Surgery | Admitting: Orthopedic Surgery

## 2012-06-30 NOTE — Progress Notes (Signed)
Physical Therapy Treatment Patient Details  Name: [name] MRN: [id number] Date of Birth: [birth year]  Today's Date: 06/30/2012 Time: 0800-0842 PT Time Calculation (min): 42 min  Visit#: 7 of 16  Re-eval: 07/16/12 Authorization: [employer]-preauthoriztion needed sent in.  Charges:  therex 32', gait 8'  Subjective: Symptoms/Limitations Symptoms: Pt. reports he has no pain today.  States was lazy over weekend. Pain Assessment Currently in Pain?: No/denies   Exercise/Treatments Aerobic Tread Mill: Gt training .77 cyc/sec leg length 90 x 8' with UE movements to normalize gait Machines for Strengthening Cybex Knee Extension: 1.5pl x 15 RLE only Cybex Knee Flexion: 5.5pl x 15 RLE only Standing Heel Raises Limitations: heel and toe walking 2RT Lateral Step Up: Step Height: 8";Right;15 reps;Hand Hold: 0 Forward Step Up: Step Height: 8";Right;15 reps;Hand Hold: 0 Step Down: Step Height: 6";15 reps;Hand Hold: Air cabin crew Board: 2 minutes SLS with Vectors: 3x 5" with intermittentHHA Gait Training: balance beam 2RT Other Standing Knee Exercises: chair pose 10x 15"    Physical Therapy Assessment and Plan PT Assessment and Plan Clinical Impression Statement: Pt. informed therapist towards end of session that balance and ability to climb a ladder is the most difficult for him.  Added balance beam activity and will attempt to get ladder from maintenance for next visit.  Pt. able to progress to 8" step for forward and lateral step ups/ 6" for forward step downs.  Able to complete vector stance and steps without use of UE's.  PT Plan: Progress balance activites and ladder training.  Add walking lunges, stairs with power ups.     Problem List Patient Active Problem List  Diagnosis  . Tibial plateau fracture  . Knee contusion  . Weakness of right leg  . Balance problem  . Difficulty walking    PT - End of Session Activity Tolerance: Patient tolerated treatment well;Patient limited  by fatigue General Behavior During Session: [facility] for tasks performed Cognition: [facility] for tasks performed   Lurena Nida, PTA/CLT 06/30/2012, 8:50 AM

## 2012-07-02 ENCOUNTER — Ambulatory Visit (HOSPITAL_COMMUNITY)
Admission: RE | Admit: 2012-07-02 | Discharge: 2012-07-02 | Disposition: A | Discharge status: Home / Self Care | Payer: 59 | Source: Ambulatory Visit | Attending: Physical Therapy | Admitting: Physical Therapy

## 2012-07-02 NOTE — Progress Notes (Signed)
Physical Therapy Treatment Patient Details  Name: [name] MRN: [id number] Date of Birth: 1950/11/08  Today's Date: 07/02/2012 Time: 0802-0846 PT Time Calculation (min): 44 min Visit#: 8 of 16  Re-eval: 07/16/12 Authorization: [employer]-preauthoriztion needed sent in.  Charges:  therex 33', gait 8'   Subjective: Symptoms/Limitations Symptoms: Pt states a little discomfort but no real pain today.   Pain Assessment Currently in Pain?: No/denies   Exercise/Treatments Aerobic Tread Mill: 8' @ 2. Machines for Strengthening Cybex Knee Extension: 1.5pl  2x15 RLE only Cybex Knee Flexion: 5.5pl 2x15 RLE only Standing Heel Raises Limitations: heel and toe walking 2RT Lateral Step Up: Step Height: 8";Right;15 reps;Hand Hold: 0 Forward Step Up: Step Height: 8";Right;15 reps;Hand Hold: 0 Step Down: Step Height: 6";15 reps;Hand Hold: Air cabin crew Board: 2 minutes SLS with Vectors: 5x 5" with intermittentHHA Gait Training: balance beam 2RT Other Standing Knee Exercises: chair pose 10x 15"  Other Standing Knee Exercises: ladder climb 3 rungs, 10X     Physical Therapy Assessment and Plan PT Assessment and Plan Clinical Impression Statement: Pt able to complete program with moderate exertion today.  improved gait cadence and eccentric control with step downs today.  Pt. able to negotiate ladder with some discomfort descending but able to complete with reciprocal pattern. PT Plan: Progress balance and functional activites.  Add walking lunges and stairs with power ups.     Problem List Patient Active Problem List  Diagnosis  . Tibial plateau fracture  . Knee contusion  . Weakness of right leg  . Balance problem  . Difficulty walking    PT - End of Session Activity Tolerance: Patient tolerated treatment well;Patient limited by fatigue General Behavior During Session: [facility] for tasks performed Cognition: [facility] for tasks performed   Lurena Nida, PTA/CLT 07/02/2012, 9:19  AM

## 2012-07-04 ENCOUNTER — Ambulatory Visit (HOSPITAL_COMMUNITY)
Admission: RE | Admit: 2012-07-04 | Discharge: 2012-07-04 | Disposition: A | Discharge status: Home / Self Care | Payer: 59 | Source: Ambulatory Visit | Attending: Orthopedic Surgery | Admitting: Orthopedic Surgery

## 2012-07-04 DIAGNOSIS — R262 Difficulty in walking, not elsewhere classified: Secondary | ICD-10-CM

## 2012-07-04 DIAGNOSIS — R29898 Other symptoms and signs involving the musculoskeletal system: Secondary | ICD-10-CM

## 2012-07-04 DIAGNOSIS — R2689 Other abnormalities of gait and mobility: Secondary | ICD-10-CM

## 2012-07-04 NOTE — Progress Notes (Signed)
Physical Therapy Treatment Patient Details  Name: RANIER COACH MRN: [id number] Date of Birth: April 09, 1951  Today's Date: 07/04/2012 Time: 0802-0848 PT Time Calculation (min): 46 min Charge: therex 38', gait 8'  Visit#: 9 of 16  Re-eval: 07/16/12    Authorization: c[employer]a-preauthoriztion needed sent in.    Subjective: Symptoms/Limitations Symptoms: Pt reported R knee feeling good today, stated most difficulty climbing ladders. Pain Assessment Currently in Pain?: No/denies  Objective:   Exercise/Treatments Aerobic Tread Mill: 8' @ 2. Machines for Strengthening Cybex Knee Extension: 1.5pl  2x15 RLE only Cybex Knee Flexion: 5.5pl 2x15 RLE only Standing Heel Raises Limitations: heel and toe walking 2RT Lateral Step Up: Step Height: 8";Right;15 reps;Hand Hold: 0 Forward Step Up: Step Height: 8";Right;15 reps;Hand Hold: 0;Limitations Forward Step Up Limitations: power ups Step Down: Step Height: 6";15 reps;Hand Hold: 0;Right Lunge Walking - Round Trips: 2 RT Rocker Board: 2 minutes;Limitations Manufacturing systems engineer Limitations: R/L no HHA SLS:    SLS with Vectors: 5x10" with intermittent HHA Gait Training: balance beam 3RT Other Standing Knee Exercises: chair pose 10x 15"  Other Standing Knee Exercises: ladder climb 3 rungs, 10X   Seated Stool scooting on carpet push and pull x 1RT  Physical Therapy Assessment and Plan PT Assessment and Plan Clinical Impression Statement: Added lunge walking, stool scooting and progressed to power-ups with forward step ups for functional strengthening, pt able to complete wtih cueing for posture and form.  Pt limtied by fatigue with new activities, no report of increased pain. PT Plan: Progress balance and functional activities.    Goals    Problem List Patient Active Problem List  Diagnosis  . Tibial plateau fracture  . Knee contusion  . Weakness of right leg  . Balance problem  . Difficulty walking    PT - End of Session Activity  Tolerance: Patient tolerated treatment well;Patient limited by fatigue General Behavior During Session: [facility] for tasks performed Cognition: [facility] for tasks performed  GP    Juel Burrow 07/04/2012, 9:37 AM

## 2012-07-07 ENCOUNTER — Ambulatory Visit (HOSPITAL_COMMUNITY)
Admission: RE | Admit: 2012-07-07 | Discharge: 2012-07-07 | Disposition: A | Discharge status: Home / Self Care | Payer: 59 | Source: Ambulatory Visit | Attending: Orthopedic Surgery | Admitting: Orthopedic Surgery

## 2012-07-07 DIAGNOSIS — R262 Difficulty in walking, not elsewhere classified: Secondary | ICD-10-CM

## 2012-07-07 DIAGNOSIS — R2689 Other abnormalities of gait and mobility: Secondary | ICD-10-CM

## 2012-07-07 DIAGNOSIS — R29898 Other symptoms and signs involving the musculoskeletal system: Secondary | ICD-10-CM

## 2012-07-07 NOTE — Progress Notes (Signed)
Physical Therapy Treatment Patient Details  Name: [name] MRN: [id number] Date of Birth: [birth year]  Today's Date: 07/07/2012 Time: 0802-0846 PT Time Calculation (min): 44 min Charge: therex 36', gait 8'  Visit#: 10 of 16  Re-eval: 07/16/12    Authorization: c[employer]a-preauthoriztion needed sent in.  Authorization Time Period:    Authorization Visit#:   of     Subjective: Symptoms/Limitations Symptoms: Pt stated pain free today, returns to work in one week. Pain Assessment Currently in Pain?: No/denies  Objective :  Exercise/Treatments Aerobic Tread Mill: 10' @ 2.9 mph Machines for Strengthening Cybex Knee Extension: 1.5pl  2x15 RLE only Cybex Knee Flexion: 5.5pl 2x15 RLE only Standing Heel Raises Limitations: heel and toe walking 2RT Lateral Step Up: Step Height: 8";Right;15 reps;Hand Hold: 0 Forward Step Up: Step Height: 8";Right;15 reps;Hand Hold: 0;Limitations Forward Step Up Limitations: power ups Step Down: Step Height: 6";15 reps;Hand Hold: 0;Right Walking with Sports Cord: thick cord 2RT  Other Standing Knee Exercises: chair pose 10x 20"  Other Standing Knee Exercises: set up/down ladder climb 3 rungs, 15x Seated Stool Scoot - Round Trips: 2RT on carpet    Physical Therapy Assessment and Plan PT Assessment and Plan Clinical Impression Statement: Pt with improved form and mechanics with gait and lunges this session.  Pt able to independently set up and down the ladder with safe mechanics.  Added sports cord for functional strengthening, pt limited by fatigue at end of session.  No report of increased pain with activities. PT Plan: Progress balance and functional activities.    Goals    Problem List Patient Active Problem List  Diagnosis  . Tibial plateau fracture  . Knee contusion  . Weakness of right leg  . Balance problem  . Difficulty walking    PT - End of Session Activity Tolerance: Patient tolerated treatment well;Patient limited by  fatigue General Behavior During Session: [facility] for tasks performed Cognition: [facility] for tasks performed  GP    Juel Burrow 07/07/2012, 9:13 AM

## 2012-07-09 ENCOUNTER — Inpatient Hospital Stay (HOSPITAL_COMMUNITY): Admission: RE | Admit: 2012-07-09 | Payer: 59 | Source: Ambulatory Visit | Admitting: *Deleted

## 2012-07-11 ENCOUNTER — Ambulatory Visit (HOSPITAL_COMMUNITY)
Admission: RE | Admit: 2012-07-11 | Discharge: 2012-07-11 | Disposition: A | Discharge status: Home / Self Care | Payer: 59 | Source: Ambulatory Visit | Attending: Orthopedic Surgery | Admitting: Orthopedic Surgery

## 2012-07-11 DIAGNOSIS — M79609 Pain in unspecified limb: Secondary | ICD-10-CM | POA: Insufficient documentation

## 2012-07-11 DIAGNOSIS — R279 Unspecified lack of coordination: Secondary | ICD-10-CM | POA: Insufficient documentation

## 2012-07-11 DIAGNOSIS — R262 Difficulty in walking, not elsewhere classified: Secondary | ICD-10-CM | POA: Insufficient documentation

## 2012-07-11 DIAGNOSIS — IMO0001 Reserved for inherently not codable concepts without codable children: Secondary | ICD-10-CM | POA: Insufficient documentation

## 2012-07-11 DIAGNOSIS — M6281 Muscle weakness (generalized): Secondary | ICD-10-CM | POA: Insufficient documentation

## 2012-07-11 NOTE — Progress Notes (Signed)
Physical Therapy Treatment Patient Details  Name: DASCHEL ROUGHTON MRN: [id number] Date of Birth: [birth year] Charge:  There ex x 40 Today's Date: 07/11/2012 Time: 6962-9528 PT Time Calculation (min): 41 min  Visit#: 11 of 16  Re-eval: 07/16/12    Authorization: c[employer]a-preauthoriztion needed sent in.  Subjective: Symptoms/Limitations Symptoms: Pt states that his leg is not getting as strong as he thinks it should be but then he states that he is not doing his exercises as often as he should be.     Exercise/Treatments   Machines for Strengthening Cybex Knee Extension: 1.5 pl x 10 L only  Cybex Knee Flexion: 5.5pl 2x15 RLE only Cybex Leg Press: 3PL x 10 R only   Standing Heel Raises Limitations: heel and toe walking 2RT Forward Lunges: Right;10 reps;Limitations Forward Lunges Limitations: on Bosu ball Functional Squat:  (Pick up ball off 6" then up onto toes) Wall Squat: 10 reps Lunge Walking - Round Trips: 2 RT Rocker Board: 3 minutes Walking with Sports Cord: thick cord 2RT  Gait Training: cable abdcution 6 pl x 10 Other Standing Knee Exercises: chair pose 4 x 20"  Other Standing Knee Exercises: side stepping with blue t-band x 2 RT    Physical Therapy Assessment and Plan PT Assessment and Plan Clinical Impression Statement: 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  Pt states the only time he has pain is when he is stressing his knee.  Pt added leg press;cable column for ab,wall squat, squat to toe raise and bosu ball for increased challenge.  Pt needed manual and verbal cuing with all new exercises.  PT Frequency: Min 3X/week PT Duration: 6 weeks PT Treatment/Interventions: Stair training;Gait training;Therapeutic exercise;Therapeutic activities;Balance training PT Plan: D/C TM add fitter next treatment if able to complete painfree.    Goals Home Exercise Program PT Goal: Perform Home Exercise Program - Progress: Met PT Short Term Goals PT Short Term Goal 1 -  Progress: Met PT Long Term Goals PT Long Term Goal 1 - Progress: Progressing toward goal Long Term Goal 3 Progress: Progressing toward goal Long Term Goal 4 Progress: Progressing toward goal Long Term Goal 5 Progress: Not met  Problem List Patient Active Problem List  Diagnosis  . Tibial plateau fracture  . Knee contusion  . Weakness of right leg  . Balance problem  . Difficulty walking    PT - End of Session Activity Tolerance: Patient tolerated treatment well;Patient limited by fatigue General Behavior During Session: [facility] for tasks performed Cognition: [facility] for tasks performed PT Plan of Care PT Home Exercise Plan: given  GP    RUSSELL,CINDY 07/11/2012, 8:56 AM

## 2012-07-14 ENCOUNTER — Encounter: Payer: Self-pay | Admitting: Orthopedic Surgery

## 2012-07-14 ENCOUNTER — Ambulatory Visit (HOSPITAL_COMMUNITY)
Admission: RE | Admit: 2012-07-14 | Discharge: 2012-07-14 | Disposition: A | Discharge status: Home / Self Care | Payer: 59 | Source: Ambulatory Visit | Attending: Orthopedic Surgery | Admitting: Orthopedic Surgery

## 2012-07-14 DIAGNOSIS — R2689 Other abnormalities of gait and mobility: Secondary | ICD-10-CM

## 2012-07-14 DIAGNOSIS — R29898 Other symptoms and signs involving the musculoskeletal system: Secondary | ICD-10-CM

## 2012-07-14 DIAGNOSIS — R262 Difficulty in walking, not elsewhere classified: Secondary | ICD-10-CM

## 2012-07-14 NOTE — Progress Notes (Addendum)
Physical Therapy Treatment Patient Details  Name: [name] MRN: [id number] Date of Birth: [birth year]  Today's Date: 07/14/2012 Time: 0807-0854 PT Time Calculation (min): 47 min There ex x 40 Visit#: 12 of 16  Re-eval: 07/16/12    Subjective: Symptoms/Limitations Symptoms: Pt reports that he returns to work today.  Pt was not sore from last treatment.   Exercise/Treatments Aerobic Stationary Bike:  (Nu-step L 3 x 7') Machines for Strengthening Cybex Knee Extension: 1.5 pl x 12 L only  Cybex Knee Flexion: 5.5pl 2x15 RLE only Cybex Leg Press: 3PL x 10 R only Plyometrics   Standing Heel Raises Limitations: heel and toe walking 2RT Forward Lunges: Right;10 reps;Limitations Forward Lunges Limitations: on Bosu ball Side Lunges:  (Fitter x 1 minute) Terminal Knee Extension: Strengthening;Right;10 reps;Theraband Theraband Level (Terminal Knee Extension): Level 4 (Blue) Functional Squat:  (Pick up ball off 6" then up onto toes) Wall Squat: 10 reps Lunge Walking - Round Trips: 2 RT Rocker Board: 3 minutes Gait Training: cable abdcution 6 pl x 12 Other Standing Knee Exercises: chair pose 10x 20"  Other Standing Knee Exercises: side stepping with blue t-band x 2 RT     Physical Therapy Assessment and Plan PT Assessment and Plan Clinical Impression Statement: PT improving in ROM and strength.  Continues to need verbal and tactile cuing to complete exercises in correct form. PT Plan: Reassess next visit.    Goals  progressing assess next visit  Problem List Patient Active Problem List  Diagnosis  . Tibial plateau fracture  . Knee contusion  . Weakness of right leg  . Balance problem  . Difficulty walking    PT - End of Session Activity Tolerance: Patient tolerated treatment well;Patient limited by fatigue General Behavior During Session: [facility] for tasks performed Cognition: [facility] for tasks performed  GP    Rosalea Withrow,CINDY 07/14/2012, 8:50 AM

## 2012-07-16 ENCOUNTER — Ambulatory Visit (HOSPITAL_COMMUNITY)
Admission: RE | Admit: 2012-07-16 | Discharge: 2012-07-16 | Disposition: A | Discharge status: Home / Self Care | Payer: 59 | Source: Ambulatory Visit | Attending: Orthopedic Surgery | Admitting: Orthopedic Surgery

## 2012-07-16 DIAGNOSIS — R262 Difficulty in walking, not elsewhere classified: Secondary | ICD-10-CM

## 2012-07-16 DIAGNOSIS — R2689 Other abnormalities of gait and mobility: Secondary | ICD-10-CM

## 2012-07-16 DIAGNOSIS — R29898 Other symptoms and signs involving the musculoskeletal system: Secondary | ICD-10-CM

## 2012-07-16 NOTE — Evaluation (Signed)
Physical Therapy Evaluation  Patient Details  Name: [name] MRN: [id number] Date of Birth: [birth year]  Today's Date: 07/16/2012 Time: 0806-0830 PT Time Calculation (min): 24 min Charge: mm test; ROM test.             Visit#: 13 of 13  Re-eval: 07/16/12 Assessment Diagnosis: S/P tibial plateau fx Next MD Visit: 07/31/2012 Prior Therapy: [facility]   Past Medical History:  Past Medical History  Diagnosis Date  . Knee fracture    Past Surgical History:  Past Surgical History  Procedure Laterality Date  . Prostatectomy  2005    [facility]. Jerre Simon  . Colonoscopy  11/28/2011    Procedure: COLONOSCOPY;  Surgeon: Malissa Hippo, MD;  Location: [facility];  Service: Endoscopy;  Laterality: N/A;  930    Subjective Symptoms/Limitations Symptoms: Mr. Nicastro states that he noted increased pain at work after about six hours.  He states she has to wear steel toe boots and was up on the concrete all day. How long can you sit comfortably?: The patient states that sitting is alright but after sitting for four hours but when he gets up his knee is very stiff but this was only for three steps.  Was 45 minutes. How long can you stand comfortably?: The patient is able to stand for an hour he never needs to stand longer than this. How long can you walk comfortably?: The patient states that he was walking all night long which is longer than he was.   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.) Leisure: Hobbies-yes (Comment)  Cognition/Observation Observation/Other Assessments Observations: Pt has no longer has a limp except after working all day  Sensation/Coordination/Flexibility/Functional  Tests Functional Tests Functional Tests: LEFS5 70/80 was 35/80  Assessment RLE AROM (degrees) Right Knee Extension: 0 Right Knee Flexion: 130 RLE Strength Right Hip Flexion: 5/5 (was 4/5) Right Hip Extension: 5/5 Right Hip ABduction: 5/5 (was 4-/5) Right Hip ADduction: 5/5 Right Knee Flexion: 5/5 (was 5/5) Right Knee Extension: 5/5 (was 4-/5) Right Ankle Dorsiflexion: 5/5 (was 3+/5) Right Ankle Plantar Flexion:  (was 3-/5 now 4+/5)  Exercise/Treatments Mobility/Balance  Static Standing Balance Single Leg Stance - Right Leg:  (25 seconds was 2) Single Leg Stance - Left Leg: 60      Physical Therapy Assessment and Plan PT Assessment and Plan Clinical Impression Statement: Pt has returned to work.  Pt has increased pain upon returning to work but states each day is getting better.  States he needs to be on his feet all night long at night. PT Plan: discharge patient    Goals Home Exercise Program Pt will Perform Home Exercise Program: Independently PT Goal: Perform Home Exercise Program - Progress: Met PT Short Term Goals Time to Complete Short Term Goals: 2 weeks PT Short Term Goal 1: Pt strength to be increased 1/2 grade to allow pt to walk up inclines with ease PT Short Term Goal 1 - Progress: Met PT Long Term Goals PT Long Term Goal 1: Pt strength to be increased one grade to allow pt to go up and down steps reciprocally without difficulty PT Long Term Goal 1 - Progress: Met PT Long Term Goal 2: Pt to  be able to walk without a limp. PT Long Term Goal 2 - Progress: Met Long Term Goal 3: Pt SLS to be 30 seconds to allow pt to climb up ladders safely. (Pt has climbed ladders in dept.  SLS at 25 seconds.) Long Term Goal 3 Progress: Progressing toward goal Long Term Goal 4: LEFS increased by 15 Long Term Goal 4 Progress: Met PT Long Term Goal 5: Pt able to run to be able to play with his  granddaughter Long Term Goal 5 Progress: Progressing toward goal (pt has jogged but  has a limp when doing so; has not played)  Problem List Patient Active Problem List  Diagnosis  . Tibial plateau fracture  . Knee contusion  . Weakness of right leg  . Balance problem  . Difficulty walking    PT - End of Session Activity Tolerance: Patient tolerated treatment well;Patient limited by fatigue General Behavior During Session: [facility] for tasks performed Cognition: [facility] for tasks performed  GP    RUSSELL,CINDY 07/16/2012, 8:30 AM  Physician Documentation Your signature is required to indicate approval of the treatment plan as stated above.  Please sign and either send electronically or make a copy of this report for your files and return this physician signed original.   Please mark one 1.__approve of plan  2. ___approve of plan with the following conditions.   ______________________________                                                          _____________________ Physician Signature                                                                                                             Date

## 2012-07-18 ENCOUNTER — Inpatient Hospital Stay (HOSPITAL_COMMUNITY): Admission: RE | Admit: 2012-07-18 | Payer: 59 | Source: Ambulatory Visit | Admitting: Physical Therapy

## 2012-07-21 ENCOUNTER — Ambulatory Visit (HOSPITAL_COMMUNITY): Payer: 59 | Admitting: Physical Therapy

## 2012-07-23 ENCOUNTER — Ambulatory Visit (HOSPITAL_COMMUNITY): Payer: 59 | Admitting: Physical Therapy

## 2012-07-28 ENCOUNTER — Ambulatory Visit (HOSPITAL_COMMUNITY): Payer: 59 | Admitting: *Deleted

## 2012-07-30 ENCOUNTER — Ambulatory Visit (HOSPITAL_COMMUNITY): Payer: 59 | Admitting: Physical Therapy

## 2012-07-31 ENCOUNTER — Ambulatory Visit: Payer: 59 | Admitting: Orthopedic Surgery

## 2012-08-05 ENCOUNTER — Ambulatory Visit (INDEPENDENT_AMBULATORY_CARE_PROVIDER_SITE_OTHER): Payer: 59 | Admitting: Orthopedic Surgery

## 2012-08-05 ENCOUNTER — Encounter: Payer: Self-pay | Admitting: Orthopedic Surgery

## 2012-08-05 VITALS — BP 110/68 | Ht 74.0 in | Wt 180.0 lb

## 2012-08-05 DIAGNOSIS — R29898 Other symptoms and signs involving the musculoskeletal system: Secondary | ICD-10-CM

## 2012-08-05 DIAGNOSIS — S8290XD Unspecified fracture of unspecified lower leg, subsequent encounter for closed fracture with routine healing: Secondary | ICD-10-CM

## 2012-08-05 DIAGNOSIS — S82141D Displaced bicondylar fracture of right tibia, subsequent encounter for closed fracture with routine healing: Secondary | ICD-10-CM

## 2012-08-05 NOTE — Progress Notes (Signed)
Patient ID: [name], male   DOB: March 08, 1951, 62 y.o.   MRN: [id number] Chief Complaint  Patient presents with  . Follow-up    6 week recheck right knee tibial plateau fracture.   Patient had a tibial plateau fracture which healed fine, then fell again so we set for a 6 week recheck on that re\re injury which did not produce fracture  He's doing well he returned to work he's having some weakness in the right leg  General appearance is normal, the patient is alert and oriented x3 with normal mood and affect. BP 110/68  Ht 6\' 2"  (1.88 m)  Wt 180 lb (81.647 kg)  BMI 23.1 kg/m2 The knee is stable in flexion extension as well as varus valgus. Motor exam is normal scans intact range of motion is close to the opposite knee  Impression status post contusion after tibial plateau fracture with adequate healing  Patient is advised to wear a supportive brace for strenuous activity  Followup as needed

## 2012-08-05 NOTE — Patient Instructions (Addendum)
Wear brace for strenuous activity

## 2013-04-06 ENCOUNTER — Encounter (HOSPITAL_COMMUNITY): Payer: Self-pay | Admitting: Emergency Medicine

## 2013-04-06 ENCOUNTER — Emergency Department (HOSPITAL_COMMUNITY)
Admission: EM | Admit: 2013-04-06 | Discharge: 2013-04-06 | Disposition: A | Discharge status: Home / Self Care | Payer: Managed Care, Other (non HMO) | Attending: Emergency Medicine | Admitting: Emergency Medicine

## 2013-04-06 DIAGNOSIS — Z87891 Personal history of nicotine dependence: Secondary | ICD-10-CM | POA: Insufficient documentation

## 2013-04-06 DIAGNOSIS — Z8781 Personal history of (healed) traumatic fracture: Secondary | ICD-10-CM | POA: Insufficient documentation

## 2013-04-06 DIAGNOSIS — W278XXA Contact with other nonpowered hand tool, initial encounter: Secondary | ICD-10-CM | POA: Insufficient documentation

## 2013-04-06 DIAGNOSIS — S61409A Unspecified open wound of unspecified hand, initial encounter: Secondary | ICD-10-CM | POA: Insufficient documentation

## 2013-04-06 DIAGNOSIS — Y929 Unspecified place or not applicable: Secondary | ICD-10-CM | POA: Insufficient documentation

## 2013-04-06 DIAGNOSIS — Y9389 Activity, other specified: Secondary | ICD-10-CM | POA: Insufficient documentation

## 2013-04-06 DIAGNOSIS — S61412A Laceration without foreign body of left hand, initial encounter: Secondary | ICD-10-CM

## 2013-04-06 MED ORDER — BACITRACIN ZINC 500 UNIT/GM EX OINT
TOPICAL_OINTMENT | CUTANEOUS | Status: AC
  Administered 2013-04-06: 1
  Filled 2013-04-06: qty 0.9

## 2013-04-06 MED ORDER — HYDROCODONE-ACETAMINOPHEN 5-325 MG PO TABS
1.0000 | ORAL_TABLET | Freq: Once | ORAL | Status: AC
  Administered 2013-04-06: 1 via ORAL
  Filled 2013-04-06: qty 1

## 2013-04-06 MED ORDER — CEPHALEXIN 500 MG PO CAPS: 500.0000 mg | ORAL_CAPSULE | Freq: Four times a day (QID) | ORAL | Status: DC

## 2013-04-06 MED ORDER — LIDOCAINE HCL (PF) 1 % IJ SOLN
5.0000 mL | Freq: Once | INTRAMUSCULAR | Status: AC
  Administered 2013-04-06: 5 mL via INTRADERMAL
  Filled 2013-04-06: qty 5

## 2013-04-06 MED ORDER — HYDROCODONE-ACETAMINOPHEN 5-325 MG PO TABS: ORAL_TABLET | ORAL | Status: DC

## 2013-04-06 NOTE — ED Notes (Signed)
Lac to lt hand with ax,  To palm

## 2013-04-06 NOTE — ED Provider Notes (Signed)
CSN: [id number]     Arrival date & time 04/06/13  1306 History   First MD Initiated Contact with Patient 04/06/13 1629     Chief Complaint  Patient presents with  . Laceration   (Consider location/radiation/quality/duration/timing/severity/associated sxs/prior Treatment) Patient is a 62 y.o. male presenting with skin laceration. The history is provided by the patient.  Laceration Location:  Hand Hand laceration location:  L palm Length (cm):  4 cm Depth:  Through dermis Quality: straight   Bleeding: controlled   Time since incident:  2 hours Injury mechanism: axe, occurred while cutting wood. Pain details:    Quality:  Aching and dull   Severity:  Mild   Timing:  Constant   Progression:  Unchanged Foreign body present:  No foreign bodies Relieved by:  Nothing Worsened by:  Nothing tried Ineffective treatments:  None tried Tetanus status:  Up to date   Past Medical History  Diagnosis Date  . Knee fracture    Past Surgical History  Procedure Laterality Date  . Prostatectomy  2005    [facility]. Jerre Simon  . Colonoscopy  11/28/2011    Procedure: COLONOSCOPY;  Surgeon: Malissa Hippo, MD;  Location: [facility];  Service: Endoscopy;  Laterality: N/A;  930   Family History  Problem Relation Age of Onset  . Colon cancer Brother   . Asthma    . Diabetes    . Arthritis     History  Substance Use Topics  . Smoking status: Former Smoker -- 1.00 packs/day for 45 years    Types: Cigarettes  . Smokeless tobacco: Not on file  . Alcohol Use: 12.0 oz/week    20 Cans of beer per week     Comment: 20 cans of beer week and little less than a half a pint per week    Review of Systems  Constitutional: Negative for fever and chills.  Musculoskeletal: Negative for arthralgias, back pain and joint swelling.  Skin: Positive for wound.       Laceration left hand  Neurological: Negative for dizziness, weakness and numbness.  Hematological: Does not bruise/bleed  easily.  All other systems reviewed and are negative.    Allergies  Codeine  Home Medications  No current outpatient prescriptions on file. BP 113/74  Pulse 65  Temp(Src) 97.4 F (36.3 C) (Oral)  Resp 20  Ht 6\' 2"  (1.88 m)  Wt 175 lb (79.379 kg)  BMI 22.46 kg/m2  SpO2 100% Physical Exam  Nursing note and vitals reviewed. Constitutional: He is oriented to person, place, and time. He appears well-developed and well-nourished. No distress.  HENT:  Head: Normocephalic and atraumatic.  Cardiovascular: Normal rate, regular rhythm, normal heart sounds and intact distal pulses.   No murmur heard. Pulmonary/Chest: Effort normal and breath sounds normal. No respiratory distress.  Musculoskeletal: He exhibits no edema.       Left hand: He exhibits tenderness and laceration. He exhibits normal range of motion, no bony tenderness, normal two-point discrimination, normal capillary refill, no deformity and no swelling. Normal sensation noted. Decreased sensation is not present in the medial redistribution and is not present in the radial distribution. Normal strength noted. He exhibits no finger abduction, no thumb/finger opposition and no wrist extension trouble.       Hands: 4 cm laceration to thenar eminence of the left thumb.  Bleeding controlled. No injuries to the deep structures visualized.  Pt has full ROM of the thumb and wrist.  Radial pulse brisk, distal sensation  itact  Neurological: He is alert and oriented to person, place, and time. He exhibits normal muscle tone. Coordination normal.  Skin: Skin is warm.    ED Course  Procedures (including critical care time) Labs Review Labs Reviewed - No data to display Imaging Review No results found.  EKG Interpretation   None       MDM    LACERATION REPAIR Performed by: Jaylend Reiland L. Authorized by: Maxwell Caul Consent: Verbal consent obtained. Risks and benefits: risks, benefits and alternatives were  discussed Consent given by: patient Patient identity confirmed: provided demographic data Prepped and Draped in normal sterile fashion Wound explored  Laceration Location: thenar eminence left thumb Laceration Length: 4 cm  No Foreign Bodies seen or palpated  Anesthesia: local infiltration  Local anesthetic: lidocaine 1 % w/o epinephrine  Anesthetic total: 4 ml  Irrigation method: syringe Amount of cleaning: standard  Skin closure: 4-0 prolene Number of sutures: 8  Technique: simple interrupted  Patient tolerance: Patient tolerated the procedure well with no immediate complications.   Pain improved, remains NV intact.  Pt has full ROM of the thumb.  Td is UTD.  Pt agrees to return for any signs of infection.  Appears stable for d/c and agrees to care plan  Alexius Hangartner L. Lewis Grivas, PA-C 04/07/13 1328

## 2013-04-06 NOTE — ED Notes (Signed)
T. Triplett, PA at bedside. 

## 2013-04-07 NOTE — ED Provider Notes (Signed)
  Medical screening examination/treatment/procedure(s) were performed by non-physician practitioner and as supervising physician I was immediately available for consultation/collaboration.  EKG Interpretation   None          Gerhard Munch, MD 04/07/13 1600

## 2014-02-10 ENCOUNTER — Emergency Department (HOSPITAL_COMMUNITY): Payer: Worker's Compensation

## 2014-02-10 ENCOUNTER — Emergency Department (HOSPITAL_COMMUNITY)
Admission: EM | Admit: 2014-02-10 | Discharge: 2014-02-10 | Disposition: A | Discharge status: Home / Self Care | Payer: Worker's Compensation | Attending: Emergency Medicine | Admitting: Emergency Medicine

## 2014-02-10 ENCOUNTER — Encounter (HOSPITAL_COMMUNITY): Payer: Self-pay | Admitting: Emergency Medicine

## 2014-02-10 DIAGNOSIS — Z87891 Personal history of nicotine dependence: Secondary | ICD-10-CM | POA: Insufficient documentation

## 2014-02-10 DIAGNOSIS — S62616A Displaced fracture of proximal phalanx of right little finger, initial encounter for closed fracture: Secondary | ICD-10-CM | POA: Insufficient documentation

## 2014-02-10 DIAGNOSIS — W208XXA Other cause of strike by thrown, projected or falling object, initial encounter: Secondary | ICD-10-CM | POA: Insufficient documentation

## 2014-02-10 DIAGNOSIS — R52 Pain, unspecified: Secondary | ICD-10-CM

## 2014-02-10 DIAGNOSIS — S6291XA Unspecified fracture of right wrist and hand, initial encounter for closed fracture: Secondary | ICD-10-CM

## 2014-02-10 DIAGNOSIS — Y9389 Activity, other specified: Secondary | ICD-10-CM | POA: Insufficient documentation

## 2014-02-10 DIAGNOSIS — Y998 Other external cause status: Secondary | ICD-10-CM | POA: Insufficient documentation

## 2014-02-10 DIAGNOSIS — S62610A Displaced fracture of proximal phalanx of right index finger, initial encounter for closed fracture: Secondary | ICD-10-CM

## 2014-02-10 DIAGNOSIS — Y9289 Other specified places as the place of occurrence of the external cause: Secondary | ICD-10-CM | POA: Insufficient documentation

## 2014-02-10 MED ORDER — PROPOFOL 10 MG/ML IV BOLUS
INTRAVENOUS | Status: AC
  Filled 2014-02-10: qty 1

## 2014-02-10 MED ORDER — PROPOFOL 10 MG/ML IV BOLUS
INTRAVENOUS | Status: DC | PRN
  Administered 2014-02-10: 50 mg via INTRAVENOUS

## 2014-02-10 MED ORDER — HYDROCODONE-ACETAMINOPHEN 5-325 MG PO TABS
ORAL_TABLET | ORAL | Status: DC
Start: 2014-02-10 — End: 2017-07-30

## 2014-02-10 NOTE — ED Provider Notes (Addendum)
CSN: [id number]     Arrival date & time 02/10/14  3254 History   First MD Initiated Contact with Patient 02/10/14 [phone]     Chief Complaint  Patient presents with  . Hand Injury     (Consider location/radiation/quality/duration/timing/severity/associated sxs/prior Treatment) HPI Patient tripped carrying a heavy piece of equipment landing. Patient sustained crush injury to the hand. Obvious deformity to the fourth and fifth digits of the right hand. Patient is right-hand dominant. Denies any sensory loss or numbness. Decreased range of motion of the fourth and fifth digits. Denies any head or neck injury. Past Medical History  Diagnosis Date  . Knee fracture    Past Surgical History  Procedure Laterality Date  . Prostatectomy  2005    [facility]. Michela Pitcher  . Colonoscopy  11/28/2011    Procedure: COLONOSCOPY;  Surgeon: Rogene Houston, MD;  Location: [facility];  Service: Endoscopy;  Laterality: N/A;  930   Family History  Problem Relation Age of Onset  . Colon cancer Brother   . Asthma    . Diabetes    . Arthritis     History  Substance Use Topics  . Smoking status: Former Smoker -- 1.00 packs/day for 45 years    Types: Cigarettes  . Smokeless tobacco: Not on file  . Alcohol Use: 12.0 oz/week    20 Cans of beer per week     Comment: 20 cans of beer week and little less than a half a pint per week    Review of Systems  Musculoskeletal: Negative for neck pain.  Skin: Negative for wound.  Neurological: Negative for weakness, numbness and headaches.  All other systems reviewed and are negative.     Allergies  Codeine  Home Medications   Prior to Admission medications   Medication Sig Start Date End Date Taking? Authorizing Provider  cephALEXin (KEFLEX) 500 MG capsule Take 1 capsule (500 mg total) by mouth 4 (four) times daily. For 7 days 04/06/13   Tammy L. Triplett, PA-C  HYDROcodone-acetaminophen (NORCO/VICODIN) 5-325 MG per tablet Take one-two tabs po  q 4-6 hrs prn pain 04/06/13   Tammy L. Triplett, PA-C   BP 138/81 mmHg  Pulse 71  Temp(Src) 97.6 F (36.4 C) (Oral)  Resp 20  Ht 6\' 2"  (1.88 m)  Wt 175 lb (79.379 kg)  BMI 22.46 kg/m2  SpO2 100% Physical Exam  Constitutional: He is oriented to person, place, and time. He appears well-developed and well-nourished. No distress.  HENT:  Head: Normocephalic and atraumatic.  Eyes: EOM are normal. Pupils are equal, round, and reactive to light.  Neck: Normal range of motion. Neck supple.  No posterior midline cervical tenderness to palpation.  Cardiovascular: Normal rate and regular rhythm.   Pulmonary/Chest: Effort normal and breath sounds normal.  Abdominal: Soft. Bowel sounds are normal.  Musculoskeletal: He exhibits tenderness. He exhibits no edema.  Patient with dorsal angulation of the fourth and fifth digits at the mid shaft of the proximal phalanx of the right hand. Decreased range of motion of these digits.good range of motion of the right wrist without tenderness or swelling. Good range of motion of the right elbow without tenderness or swelling. Distal cap refill is intact.  Neurological: He is alert and oriented to person, place, and time.  Sensation fully intact. Decreased range of motion of the fourth and fifth digits.  Skin: Skin is warm and dry. No rash noted. No erythema.  Psychiatric: He has a normal mood and affect.  His behavior is normal.  Nursing note and vitals reviewed.   ED Course  Reduction of fracture Date/Time: 02/10/2014 7:36 AM Performed by: Julianne Rice Authorized by: Lita Mains, Huldah Marin Consent: Written consent obtained. Consent given by: patient Patient understanding: patient states understanding of the procedure being performed Patient consent: the patient's understanding of the procedure matches consent given Imaging studies: imaging studies available Patient identity confirmed: verbally with patient Time out: Immediately prior to procedure a "time  out" was called to verify the correct patient, procedure, equipment, support staff and site/side marked as required. Patient sedated: yes Sedatives: propofol Vitals: Vital signs were monitored during sedation. Patient tolerance: Patient tolerated the procedure well with no immediate complications Comments: Fracture dislocation of the proximal phalanges of the fourth and fifth digit of the right hand or manually reduced a volar splint applied. Good cap refill post splinting. No obvious rotation. Patient tolerated well.   (including critical care time) Labs Review Labs Reviewed - No data to display  Imaging Review Dg Hand Complete Right  02/10/2014   CLINICAL DATA:  Injury to the right hand with deformity of the ring and little fingers.  EXAM: RIGHT HAND - COMPLETE 3+ VIEW  COMPARISON:  None.  FINDINGS: Comminuted a mostly transverse fractures of the proximal phalanges of the right fourth and fifth fingers with dorsal angulation of the distal fracture fragments. No intra-articular extension. Old fracture deformity of the fifth metacarpal bone. Soft tissue swelling is present.  IMPRESSION: Comminuted fractures of the proximal phalangeal shafts of the right fourth and fifth fingers with dorsal angulation.   Electronically Signed   By: Lucienne Capers M.D.   On: 02/10/2014 05:55     EKG Interpretation None      MDM   Final diagnoses:  None      Discussed with Dr. Grandville Silos. Recommends reduction, splinting and follow-up in his office.   Improvement of dorsal angulation of the fourth and fifth proximal phalanges. Splint applied. Patient is aware he needs to call the hand surgeon this morning to schedule follow-up appointment. He's been given strict return precautions and has voice understanding.  Julianne Rice, MD 02/10/14 [phone]  Procedural sedation Performed by: Julianne Rice Consent: Verbal consent obtained. Risks and benefits: risks, benefits and alternatives were  discussed Required items: required blood products, implants, devices, and special equipment available Patient identity confirmed: arm band and provided demographic data Time out: Immediately prior to procedure a "time out" was called to verify the correct patient, procedure, equipment, support staff and site/side marked as required.  Sedation type: moderate (conscious) sedation NPO time confirmed and considedered  Sedatives: PROPOFOL 100 mg   Physician Time at Bedside:  0630-0700  Vitals: Vital signs were monitored during sedation. Cardiac Monitor, pulse oximeter Patient tolerance: Patient tolerated the procedure well with no immediate complications. Comments: Pt with uneventful recovered. Returned to pre-procedural sedation baseline     Julianne Rice, MD 02/17/14 0518  Sedation Stop Time 0700  Julianne Rice, MD 02/21/14 [phone]

## 2014-02-10 NOTE — Discharge Instructions (Signed)
Call and make an appointment to follow-up with Dr. Grandville Silos. Take pain medication as prescribed. Return immediately for worsening pain, numbness, swelling or for any concerns.  Finger Fracture Fractures of fingers are breaks in the bones of the fingers. There are many types of fractures. There are different ways of treating these fractures. Your health care provider will discuss the best way to treat your fracture. CAUSES Traumatic injury is the main cause of broken fingers. These include:  Injuries while playing sports.  Workplace injuries.  Falls. RISK FACTORS Activities that can increase your risk of finger fractures include:  Sports.  Workplace activities that involve machinery.  A condition called osteoporosis, which can make your bones less dense and cause them to fracture more easily. SIGNS AND SYMPTOMS The main symptoms of a broken finger are pain and swelling within 15 minutes after the injury. Other symptoms include:  Bruising of your finger.  Stiffness of your finger.  Numbness of your finger.  Exposed bones (compound fracture) if the fracture is severe. DIAGNOSIS  The best way to diagnose a broken bone is with X-ray imaging. Additionally, your health care provider will use this X-ray image to evaluate the position of the broken finger bones.  TREATMENT  Finger fractures can be treated with:   Nonreduction--This means the bones are in place. The finger is splinted without changing the positions of the bone pieces. The splint is usually left on for about a week to 10 days. This will depend on your fracture and what your health care provider thinks.  Closed reduction--The bones are put back into position without using surgery. The finger is then splinted.  Open reduction and internal fixation--The fracture site is opened. Then the bone pieces are fixed into place with pins or some type of hardware. This is seldom required. It depends on the severity of the  fracture. HOME CARE INSTRUCTIONS   Follow your health care provider's instructions regarding activities, exercises, and physical therapy.  Only take over-the-counter or prescription medicines for pain, discomfort, or fever as directed by your health care provider. SEEK MEDICAL CARE IF: You have pain or swelling that limits the motion or use of your fingers. SEEK IMMEDIATE MEDICAL CARE IF:  Your finger becomes numb. MAKE SURE YOU:   Understand these instructions.  Will watch your condition.  Will get help right away if you are not doing well or get worse. Document Released: 07/08/2000 Document Revised: 01/14/2013 Document Reviewed: 11/05/2012 [facility] Patient Information 2015 [place], [place]. This information is not intended to replace advice given to you by your health care provider. Make sure you discuss any questions you have with your health care provider.

## 2014-02-10 NOTE — ED Notes (Signed)
Patient states he "smashed" his right hand in a format machine about 45 minutes ago.  Obvious deformity to right ring and pinky finger; swelling noted to right hand.

## 2014-02-10 NOTE — ED Notes (Signed)
Patient is having a hand reduction procedure done to due to hand being broken.

## 2014-02-11 ENCOUNTER — Encounter (HOSPITAL_COMMUNITY): Payer: Self-pay | Admitting: *Deleted

## 2014-02-11 ENCOUNTER — Other Ambulatory Visit (HOSPITAL_COMMUNITY): Payer: Self-pay | Admitting: Orthopaedic Surgery

## 2014-02-11 MED ORDER — CEFAZOLIN SODIUM-DEXTROSE 2-3 GM-% IV SOLR
2.0000 g | INTRAVENOUS | Status: AC
  Administered 2014-02-12: 2 g via INTRAVENOUS
  Filled 2014-02-11: qty 50

## 2014-02-11 NOTE — H&P (Signed)
PREOPERATIVE H&P  Chief Complaint: Right 4th and 5th finger fractures  HPI: [name] is a 63 y.o. male who presents for surgical treatment of Right 4th and 5th finger fractures.  He denies any changes in medical history.  Past Medical History  Diagnosis Date  . Knee fracture   . Fracture of finger of right hand     4th and 5 th digit   Past Surgical History  Procedure Laterality Date  . Prostatectomy  2005    [facility]. Michela Pitcher  . Colonoscopy  11/28/2011    Procedure: COLONOSCOPY;  Surgeon: Rogene Houston, MD;  [facility];  Service: Endoscopy;  Laterality: N/A;  930   History   Social History  . Marital Status: Married    Spouse Name: N/A    Number of Children: N/A  . Years of Education: 11   Occupational History  .     Social History Main Topics  . Smoking status: Former Smoker -- 1.00 packs/day for 45 years    Types: Cigarettes  . Smokeless tobacco: Never Used  . Alcohol Use: 12.0 oz/week    20 Cans of beer per week     Comment: 20 cans of beer week and little less than a half a pint per week  . Drug Use: Yes    Special: Marijuana  . Sexual Activity: None   Other Topics Concern  . None   Social History Narrative   Family History  Problem Relation Age of Onset  . Colon cancer Brother   . Asthma    . Diabetes    . Arthritis     Allergies  Allergen Reactions  . Codeine Itching   Prior to Admission medications   Medication Sig Start Date End Date Taking? Authorizing Provider  cephALEXin (KEFLEX) 500 MG capsule Take 1 capsule (500 mg total) by mouth 4 (four) times daily. For 7 days 04/06/13   Tammy L. Triplett, PA-C  HYDROcodone-acetaminophen (NORCO/VICODIN) 5-325 MG per tablet Take one-two tabs po q 4-6 hrs prn pain 02/10/14   Julianne Rice, MD     Positive ROS: All other systems have been reviewed and were otherwise negative with the exception of those mentioned in the HPI and as above.  Physical Exam: General: Alert, no  acute distress Cardiovascular: No pedal edema Respiratory: No cyanosis, no use of accessory musculature GI: abdomen soft Skin: No lesions in the area of chief complaint Neurologic: Sensation intact distally Psychiatric: Patient is competent for consent with normal mood and affect Lymphatic: no lymphedema  MUSCULOSKELETAL: unchanged  Assessment: Right 4th and 5th finger fractures  Plan: Plan for Procedure(s): CLOSED REDUCTION PERCUTANEOUS PINNING 4TH AND 5TH FINGERS  The risks benefits and alternatives were discussed with the patient including but not limited to the risks of nonoperative treatment, versus surgical intervention including infection, bleeding, nerve injury,  blood clots, cardiopulmonary complications, morbidity, mortality, among others, and they were willing to proceed.   Marianna Payment, MD   02/11/2014 4:12 PM

## 2014-02-12 ENCOUNTER — Ambulatory Visit (HOSPITAL_COMMUNITY): Payer: Worker's Compensation | Admitting: Anesthesiology

## 2014-02-12 ENCOUNTER — Encounter (HOSPITAL_COMMUNITY): Payer: Self-pay

## 2014-02-12 ENCOUNTER — Ambulatory Visit (HOSPITAL_COMMUNITY)
Admission: RE | Admit: 2014-02-12 | Discharge: 2014-02-12 | Disposition: A | Discharge status: Home / Self Care | Payer: Worker's Compensation | Source: Ambulatory Visit | Attending: Orthopaedic Surgery | Admitting: Orthopaedic Surgery

## 2014-02-12 ENCOUNTER — Encounter (HOSPITAL_COMMUNITY)
Admission: RE | Disposition: A | Discharge status: Home / Self Care | Payer: Self-pay | Source: Ambulatory Visit | Attending: Orthopaedic Surgery

## 2014-02-12 DIAGNOSIS — S62616A Displaced fracture of proximal phalanx of right little finger, initial encounter for closed fracture: Secondary | ICD-10-CM | POA: Insufficient documentation

## 2014-02-12 DIAGNOSIS — Z87891 Personal history of nicotine dependence: Secondary | ICD-10-CM | POA: Diagnosis not present

## 2014-02-12 DIAGNOSIS — Z885 Allergy status to narcotic agent status: Secondary | ICD-10-CM | POA: Insufficient documentation

## 2014-02-12 DIAGNOSIS — S62614A Displaced fracture of proximal phalanx of right ring finger, initial encounter for closed fracture: Secondary | ICD-10-CM | POA: Insufficient documentation

## 2014-02-12 DIAGNOSIS — X58XXXA Exposure to other specified factors, initial encounter: Secondary | ICD-10-CM | POA: Diagnosis not present

## 2014-02-12 HISTORY — PX: PERCUTANEOUS PINNING: SHX2209

## 2014-02-12 HISTORY — DX: Fracture of unspecified phalanx of unspecified finger, initial encounter for closed fracture: S62.609A

## 2014-02-12 HISTORY — DX: Malignant (primary) neoplasm, unspecified: C80.1

## 2014-02-12 LAB — BASIC METABOLIC PANEL
Anion gap: 14 (ref 5–15)
BUN: 13 mg/dL (ref 6–23)
CO2: 20 meq/L (ref 19–32)
CREATININE: 0.96 mg/dL (ref 0.50–1.35)
Calcium: 8.8 mg/dL (ref 8.4–10.5)
Chloride: 105 mEq/L (ref 96–112)
GFR calc Af Amer: >90 mL/min (ref >90)
GFR calc non Af Amer: 88 mL/min — ABNORMAL LOW (ref >90)
GLUCOSE: 79 mg/dL (ref 70–99)
Potassium: 4.7 mEq/L (ref 3.7–5.3)
Sodium: 139 mEq/L (ref 137–147)

## 2014-02-12 LAB — CBC
HEMATOCRIT: 37.6 % — AB (ref 39.0–52.0)
Hemoglobin: 12.7 g/dL — ABNORMAL LOW (ref 13.0–17.0)
MCH: 30.9 pg (ref 26.0–34.0)
MCHC: 33.8 g/dL (ref 30.0–36.0)
MCV: 91.5 fL (ref 78.0–100.0)
PLATELETS: 205 10*3/uL (ref 150–400)
RBC: 4.11 MIL/uL — ABNORMAL LOW (ref 4.22–5.81)
RDW: 13.5 % (ref 11.5–15.5)
WBC: 7.8 10*3/uL (ref 4.0–10.5)

## 2014-02-12 SURGERY — PINNING, EXTREMITY, PERCUTANEOUS
Anesthesia: General | Site: Finger

## 2014-02-12 MED ORDER — FENTANYL CITRATE 0.05 MG/ML IJ SOLN
INTRAMUSCULAR | Status: AC
  Filled 2014-02-12: qty 5

## 2014-02-12 MED ORDER — HYDROMORPHONE HCL 1 MG/ML IJ SOLN
INTRAMUSCULAR | Status: AC
  Filled 2014-02-12: qty 1

## 2014-02-12 MED ORDER — DEXAMETHASONE SODIUM PHOSPHATE 4 MG/ML IJ SOLN
INTRAMUSCULAR | Status: DC | PRN
  Administered 2014-02-12: 4 mg via INTRAVENOUS

## 2014-02-12 MED ORDER — FENTANYL CITRATE 0.05 MG/ML IJ SOLN
INTRAMUSCULAR | Status: DC | PRN
  Administered 2014-02-12 (×5): 50 ug via INTRAVENOUS

## 2014-02-12 MED ORDER — LIDOCAINE HCL (CARDIAC) 20 MG/ML IV SOLN
INTRAVENOUS | Status: AC
  Filled 2014-02-12: qty 5

## 2014-02-12 MED ORDER — ONDANSETRON HCL 4 MG/2ML IJ SOLN
INTRAMUSCULAR | Status: AC
  Filled 2014-02-12: qty 2

## 2014-02-12 MED ORDER — MIDAZOLAM HCL 5 MG/5ML IJ SOLN
INTRAMUSCULAR | Status: DC | PRN
  Administered 2014-02-12 (×2): 1 mg via INTRAVENOUS

## 2014-02-12 MED ORDER — DEXTROSE 5 % IV SOLN
INTRAVENOUS | Status: DC | PRN
  Administered 2014-02-12: 10:00:00 via INTRAVENOUS

## 2014-02-12 MED ORDER — LIDOCAINE HCL (CARDIAC) 20 MG/ML IV SOLN
INTRAVENOUS | Status: DC | PRN
  Administered 2014-02-12: 70 mg via INTRAVENOUS

## 2014-02-12 MED ORDER — HYDROCODONE-ACETAMINOPHEN 5-325 MG PO TABS: 1.0000 | ORAL_TABLET | Freq: Four times a day (QID) | ORAL | Status: DC | PRN

## 2014-02-12 MED ORDER — LACTATED RINGERS IV SOLN
INTRAVENOUS | Status: DC
  Administered 2014-02-12: 08:00:00 via INTRAVENOUS

## 2014-02-12 MED ORDER — LACTATED RINGERS IV SOLN
INTRAVENOUS | Status: DC | PRN
  Administered 2014-02-12 (×2): via INTRAVENOUS

## 2014-02-12 MED ORDER — PROPOFOL 10 MG/ML IV BOLUS
INTRAVENOUS | Status: DC | PRN
  Administered 2014-02-12: 200 mg via INTRAVENOUS
  Administered 2014-02-12: 30 mg via INTRAVENOUS

## 2014-02-12 MED ORDER — MIDAZOLAM HCL 2 MG/2ML IJ SOLN
INTRAMUSCULAR | Status: AC
  Filled 2014-02-12: qty 2

## 2014-02-12 MED ORDER — HYDROMORPHONE HCL 1 MG/ML IJ SOLN
0.2500 mg | INTRAMUSCULAR | Status: DC | PRN
  Administered 2014-02-12 (×4): 0.5 mg via INTRAVENOUS

## 2014-02-12 MED ORDER — 0.9 % SODIUM CHLORIDE (POUR BTL) OPTIME
TOPICAL | Status: DC | PRN
  Administered 2014-02-12: 1000 mL

## 2014-02-12 MED ORDER — DEXAMETHASONE SODIUM PHOSPHATE 4 MG/ML IJ SOLN
INTRAMUSCULAR | Status: AC
  Filled 2014-02-12: qty 1

## 2014-02-12 MED ORDER — HYDROMORPHONE HCL 1 MG/ML IJ SOLN
INTRAMUSCULAR | Status: AC
  Administered 2014-02-12: 0.5 mg via INTRAVENOUS
  Filled 2014-02-12: qty 1

## 2014-02-12 MED ORDER — ONDANSETRON HCL 4 MG/2ML IJ SOLN
INTRAMUSCULAR | Status: DC | PRN
  Administered 2014-02-12: 4 mg via INTRAVENOUS

## 2014-02-12 MED ORDER — PROPOFOL 10 MG/ML IV BOLUS
INTRAVENOUS | Status: AC
  Filled 2014-02-12: qty 20

## 2014-02-12 SURGICAL SUPPLY — 39 items
0.035" K-Wire ×6 IMPLANT
0.035" k-wire ×5 IMPLANT
BANDAGE ELASTIC 3 VELCRO ST LF (GAUZE/BANDAGES/DRESSINGS) ×3 IMPLANT
BANDAGE ELASTIC 4 VELCRO ST LF (GAUZE/BANDAGES/DRESSINGS) IMPLANT
BENZOIN TINCTURE PRP APPL 2/3 (GAUZE/BANDAGES/DRESSINGS) IMPLANT
BLADE SURG ROTATE 9660 (MISCELLANEOUS) IMPLANT
BNDG ELASTIC 2 VLCR STRL LF (GAUZE/BANDAGES/DRESSINGS) ×3 IMPLANT
BNDG ESMARK 4X9 LF (GAUZE/BANDAGES/DRESSINGS) ×3 IMPLANT
BNDG GAUZE ELAST 4 BULKY (GAUZE/BANDAGES/DRESSINGS) ×3 IMPLANT
CLOSURE WOUND 1/2 X4 (GAUZE/BANDAGES/DRESSINGS)
COVER SURGICAL LIGHT HANDLE (MISCELLANEOUS) ×3 IMPLANT
CUFF TOURNIQUET SINGLE 18IN (TOURNIQUET CUFF) ×3 IMPLANT
CUFF TOURNIQUET SINGLE 24IN (TOURNIQUET CUFF) IMPLANT
DURAPREP 26ML APPLICATOR (WOUND CARE) IMPLANT
ELECT CAUTERY BLADE 6.4 (BLADE) ×3 IMPLANT
FACESHIELD WRAPAROUND (MASK) IMPLANT
GAUZE SPONGE 4X4 12PLY STRL (GAUZE/BANDAGES/DRESSINGS) IMPLANT
GAUZE XEROFORM 1X8 LF (GAUZE/BANDAGES/DRESSINGS) ×3 IMPLANT
GLOVE SS BIOGEL STRL SZ 7.5 (GLOVE) ×2 IMPLANT
GLOVE SUPERSENSE BIOGEL SZ 7.5 (GLOVE) ×4
GOWN STRL REIN XL XLG (GOWN DISPOSABLE) ×3 IMPLANT
GOWN STRL REUS W/ TWL LRG LVL3 (GOWN DISPOSABLE) ×2 IMPLANT
GOWN STRL REUS W/TWL LRG LVL3 (GOWN DISPOSABLE) ×4
KIT BASIN OR (CUSTOM PROCEDURE TRAY) ×3 IMPLANT
KIT ROOM TURNOVER OR (KITS) ×3 IMPLANT
NS IRRIG 1000ML POUR BTL (IV SOLUTION) ×3 IMPLANT
PACK ORTHO EXTREMITY (CUSTOM PROCEDURE TRAY) ×3 IMPLANT
PAD ARMBOARD 7.5X6 YLW CONV (MISCELLANEOUS) ×6 IMPLANT
PAD CAST 3X4 CTTN HI CHSV (CAST SUPPLIES) ×1 IMPLANT
PADDING CAST COTTON 2X4 NS (CAST SUPPLIES) ×3 IMPLANT
PADDING CAST COTTON 3X4 STRL (CAST SUPPLIES) ×2
SPONGE GAUZE 4X4 12PLY STER LF (GAUZE/BANDAGES/DRESSINGS) ×3 IMPLANT
STRIP CLOSURE SKIN 1/2X4 (GAUZE/BANDAGES/DRESSINGS) IMPLANT
SUT ETHILON 4 0 P 3 18 (SUTURE) IMPLANT
SUT PROLENE 4 0 P 3 18 (SUTURE) IMPLANT
TOWEL OR 17X24 6PK STRL BLUE (TOWEL DISPOSABLE) ×3 IMPLANT
TOWEL OR 17X26 10 PK STRL BLUE (TOWEL DISPOSABLE) ×3 IMPLANT
UNDERPAD 30X30 INCONTINENT (UNDERPADS AND DIAPERS) ×3 IMPLANT
WATER STERILE IRR 1000ML POUR (IV SOLUTION) IMPLANT

## 2014-02-12 NOTE — Anesthesia Postprocedure Evaluation (Signed)
Anesthesia Post Note  Patient: [name]  Procedure(s) Performed: Procedure(s) (LRB): CLOSED REDUCTION PERCUTANEOUS PINNING 4TH AND 5TH FINGERS (N/A)  Anesthesia type: General  Patient location: PACU  Post pain: Pain level controlled and Adequate analgesia  Post assessment: Post-op Vital signs reviewed, Patient's Cardiovascular Status Stable, Respiratory Function Stable, Patent Airway and Pain level controlled  Last Vitals:  Filed Vitals:   02/12/14 1300  BP: 139/91  Pulse: 80  Temp: 36.6 C  Resp: 12    Post vital signs: Reviewed and stable  Level of consciousness: awake, alert  and oriented  Complications: No apparent anesthesia complications

## 2014-02-12 NOTE — Discharge Instructions (Signed)
1. Keep splint clean and dry

## 2014-02-12 NOTE — Transfer of Care (Signed)
Immediate Anesthesia Transfer of Care Note  Patient: [name]  Procedure(s) Performed: Procedure(s): CLOSED REDUCTION PERCUTANEOUS PINNING 4TH AND 5TH FINGERS (N/A)  Patient Location: PACU  Anesthesia Type:General  Level of Consciousness: awake, alert , oriented and patient cooperative  Airway & Oxygen Therapy: Patient Spontanous Breathing and Patient connected to face mask oxygen  Post-op Assessment: Report given to PACU RN, Post -op Vital signs reviewed and stable, Patient moving all extremities and shivering  Post vital signs: Reviewed and stable  Complications: No apparent anesthesia complications

## 2014-02-12 NOTE — Anesthesia Preprocedure Evaluation (Addendum)
Anesthesia Evaluation  Patient identified by MRN, date of birth, ID band Patient awake    Reviewed: Allergy & Precautions, H&P , NPO status , Patient's Chart, lab work & pertinent test results  Airway Mallampati: II  TM Distance: >3 FB Neck ROM: Full    Dental no notable dental hx. (+) Teeth Intact, Dental Advisory Given   Pulmonary neg pulmonary ROS, former smoker,  breath sounds clear to auscultation  Pulmonary exam normal       Cardiovascular negative cardio ROS  Rhythm:Regular Rate:Normal     Neuro/Psych negative neurological ROS  negative psych ROS   GI/Hepatic negative GI ROS, Neg liver ROS,   Endo/Other  negative endocrine ROS  Renal/GU negative Renal ROS  negative genitourinary   Musculoskeletal   Abdominal   Peds  Hematology negative hematology ROS (+)   Anesthesia Other Findings   Reproductive/Obstetrics negative OB ROS                           Anesthesia Physical Anesthesia Plan  ASA: II  Anesthesia Plan: General   Post-op Pain Management:    Induction: Intravenous  Airway Management Planned: LMA  Additional Equipment:   Intra-op Plan:   Post-operative Plan: Extubation in OR  Informed Consent: I have reviewed the patients History and Physical, chart, labs and discussed the procedure including the risks, benefits and alternatives for the proposed anesthesia with the patient or authorized representative who has indicated his/her understanding and acceptance.   Dental advisory given  Plan Discussed with: CRNA  Anesthesia Plan Comments:         Anesthesia Quick Evaluation

## 2014-02-12 NOTE — Op Note (Signed)
Date of surgery: 02/12/2014  Preoperative diagnosis: Comminuted fractures of the proximal phalanges of the right ring and little finger  Postoperative diagnosis: Same  Procedure: 1. Percutaneous skeletal fixation of the right ring proximal phalanx fracture 2. Percutaneous skeletal fixation of the right little proximal phalanx fracture  Surgeon: Eduard Roux, M.D.  Anesthesia: Gen.  Estimated blood loss: Minimal  Complications: None  Indications for procedure: The patient is a 63 year old gentleman who presents today for the above-mentioned conditions. He was indicated for operative fixation.  The risks, benefits, and alternatives to surgery were again discussed with the patient and he elected to proceed.  Description of procedure: The patient was identified in the preoperative holding area. The operative site was marked by the surgeon and confirmed with the patient. He is brought back to the operating room. His placed supine on the table. General anesthesia was induced. Nonsterile tourniquet was placed on the upper right arm.  Right upper extremity was prepped and draped in standard sterile fashion. Timeout was performed preoperative antibiotics given. Tourniquet inflated to 250 mmHg. Using closed reduction manipulation we were able to achieve a reduction of both the fingers. Under fluoroscopy two parallel 0.035 inch K wires were inserted in antegrade fashion through the metacarpal head into the proximal phalanx. There placement was confirmed under fluoroscopy. Once this was done for both fingers sterile dressings were applied. The hand was immobilized in a ulnar gutter splint.the patient tolerated the procedure well was extubated and transferred to the PACU in stable condition.  Postoperative plan: Patient will be no weightbearing to the right hand. He is to keep the splint on at all times. He will follow up in 2 weeks for x-rays and wound check.  Azucena Cecil, MD [facility] [phone] 11:24 AM

## 2014-02-16 ENCOUNTER — Encounter (HOSPITAL_COMMUNITY): Payer: Self-pay | Admitting: Orthopaedic Surgery

## 2016-05-11 ENCOUNTER — Encounter: Payer: Self-pay | Admitting: Internal Medicine

## 2016-11-30 ENCOUNTER — Encounter (INDEPENDENT_AMBULATORY_CARE_PROVIDER_SITE_OTHER): Payer: Self-pay | Admitting: *Deleted

## 2017-04-18 ENCOUNTER — Encounter (INDEPENDENT_AMBULATORY_CARE_PROVIDER_SITE_OTHER): Payer: Self-pay | Admitting: *Deleted

## 2017-05-06 ENCOUNTER — Encounter (INDEPENDENT_AMBULATORY_CARE_PROVIDER_SITE_OTHER): Payer: Self-pay | Admitting: *Deleted

## 2017-05-08 ENCOUNTER — Other Ambulatory Visit (INDEPENDENT_AMBULATORY_CARE_PROVIDER_SITE_OTHER): Payer: Self-pay | Admitting: *Deleted

## 2017-05-08 DIAGNOSIS — Z860100 Personal history of colon polyps, unspecified: Secondary | ICD-10-CM | POA: Insufficient documentation

## 2017-05-08 DIAGNOSIS — Z800 Family history of malignant neoplasm of digestive organs: Secondary | ICD-10-CM

## 2017-05-11 ENCOUNTER — Ambulatory Visit (INDEPENDENT_AMBULATORY_CARE_PROVIDER_SITE_OTHER): Payer: BLUE CROSS/BLUE SHIELD

## 2017-05-11 ENCOUNTER — Ambulatory Visit (HOSPITAL_COMMUNITY)
Admission: EM | Admit: 2017-05-11 | Discharge: 2017-05-11 | Disposition: A | Discharge status: Home / Self Care | Payer: BLUE CROSS/BLUE SHIELD | Attending: Family Medicine | Admitting: Family Medicine

## 2017-05-11 ENCOUNTER — Encounter (HOSPITAL_COMMUNITY): Payer: Self-pay | Admitting: *Deleted

## 2017-05-11 DIAGNOSIS — S82455A Nondisplaced comminuted fracture of shaft of left fibula, initial encounter for closed fracture: Secondary | ICD-10-CM

## 2017-05-11 DIAGNOSIS — S82892A Other fracture of left lower leg, initial encounter for closed fracture: Secondary | ICD-10-CM

## 2017-05-11 DIAGNOSIS — M25572 Pain in left ankle and joints of left foot: Secondary | ICD-10-CM | POA: Diagnosis not present

## 2017-05-11 MED ORDER — IBUPROFEN 800 MG PO TABS
800.0000 mg | ORAL_TABLET | Freq: Once | ORAL | Status: AC
  Administered 2017-05-11: 800 mg via ORAL

## 2017-05-11 MED ORDER — IBUPROFEN 800 MG PO TABS
ORAL_TABLET | ORAL | Status: AC
  Filled 2017-05-11: qty 1

## 2017-05-11 NOTE — ED Triage Notes (Signed)
Patient states he fell of ladder injuring left ankle. Patient reports pain with ambulation. Reports swelling and pain. Swelling noted on exam.

## 2017-05-11 NOTE — Discharge Instructions (Addendum)
Please rest, ice and elevate the affected extremity. You may take Motrin 600mg  every 8 hours, as needed for pain (take with food). Follow up with orthopaedic surgery within one week for further evaluation. Please call for any appointment. Wear the walking boot during the day. Please return here if you are experiencing increased pain, tingling/numbness, swelling, redness, or fever.

## 2017-05-13 NOTE — ED Provider Notes (Signed)
[facility]   [id number] 05/11/17 Arrival Time: 6967  ASSESSMENT & PLAN:  1. Acute left ankle pain   2. Closed fracture of left ankle, initial encounter    Imaging: Dg Ankle Complete Left  Result Date: 05/11/2017 CLINICAL DATA:  Patient fell approximately 7 foot off of a ladder earlier today and injured the left ankle. Lateral pain and swelling. Initial encounter. EXAM: LEFT ANKLE COMPLETE - 3+ VIEW COMPARISON:  None. FINDINGS: Comminuted, nondisplaced fracture involving the distal fibula. Ankle joint anatomically aligned with a well-preserved joint space. No fractures elsewhere involving the bones surrounding the ankle joint. Large ankle joint effusion/hemarthrosis. Well-preserved bone mineral density. Possible avulsion fracture arising from one of the bones from the midfoot along the plantar surface, seen only on the lateral view. IMPRESSION: 1. Comminuted nondisplaced fracture involving the distal fibula just above the lateral malleolus. 2. Possible avulsion fracture arising from a bone in the midfoot, visible only on the lateral image. Please correlate with point tenderness over the plantar surface of the midfoot. Dedicated foot x-rays may be helpful to confirm or deny this finding. Electronically Signed   By: Evangeline Dakin M.D.   On: 05/11/2017 17:52   Meds ordered this encounter  Medications  . ibuprofen (ADVIL,MOTRIN) tablet 800 mg   OTC analgesics as needed. Walking boot for WBAT. To arrange orthopaedic follow up within one week; he notes to ask orthopaedist about doing a dedicated foot x-ray. May f/u here as needed.  Reviewed expectations re: course of current medical issues. Questions answered. Outlined signs and symptoms indicating need for more acute intervention. Patient verbalized understanding. After Visit Summary given.  SUBJECTIVE: History from: patient. [name] is a 67 y.o. male who reports persistent pain of his left lateral ankle with swelling. Onset  abrupt beginning several today after falling from ladder. "Think I rolled my ankle when I landed." Discomfort described as aching without radiation. Extremity sensation changes or weakness: none. Self treatment: has not tried OTCs for relief of pain. Ambulatory with pain reported. No head injury reported. Any movement of his ankle exacerbates discomfort.  ROS: As per HPI.   OBJECTIVE:  Vitals:   05/11/17 1659  BP: (!) 148/79  Pulse: 79  Resp: 18  Temp: 98 F (36.7 C)  TempSrc: Oral  SpO2: 100%    General appearance: alert; no distress Extremities: no cyanosis or edema; symmetrical with no gross deformities; tenderness over his leftlateral ankle with moderate swelling and no bruising; mainly tender over lateral posterior malleolus; ROM: limited by pain CV: normal extremity capillary refill Skin: warm and dry Neurologic: normal gait; normal symmetric reflexes in all extremities; normal sensation in all extremities Psychological: alert and cooperative; normal mood and affect  Allergies  Allergen Reactions  . Codeine Itching    Past Medical History:  Diagnosis Date  . Cancer [facility])    prostate  . Fracture of finger of right hand    4th and 5 th digit  . Knee fracture    Social History   Socioeconomic History  . Marital status: Married    Spouse name: Not on file  . Number of children: Not on file  . Years of education: 35  . Highest education level: Not on file  Social Needs  . Financial resource strain: Not on file  . Food insecurity - worry: Not on file  . Food insecurity - inability: Not on file  . Transportation needs - medical: Not on file  . Transportation needs - non-medical: Not  on file  Occupational History    Employer: [employer]  Tobacco Use  . Smoking status: Former Smoker    Packs/day: 1.00    Years: 45.00    Pack years: 45.00    Types: Cigarettes  . Smokeless tobacco: Never Used  Substance and Sexual Activity  . Alcohol use: Yes     Alcohol/week: 12.0 oz    Types: 20 Cans of beer per week    Comment: 20 cans of beer week and little less than a half a pint per week  . Drug use: Yes    Types: Marijuana    Comment: 02/10/2014  . Sexual activity: Not on file  Other Topics Concern  . Not on file  Social History Narrative  . Not on file   Family History  Problem Relation Age of Onset  . Colon cancer Brother   . Asthma Unknown   . Diabetes Unknown   . Arthritis Unknown    Past Surgical History:  Procedure Laterality Date  . COLONOSCOPY  11/28/2011   Procedure: COLONOSCOPY;  Surgeon: Rogene Houston, MD;  [facility];  Service: Endoscopy;  Laterality: N/A;  930  . PERCUTANEOUS PINNING N/A 02/12/2014   Procedure: CLOSED REDUCTION PERCUTANEOUS PINNING 4TH AND 5TH FINGERS;  Surgeon: Marianna Payment, MD;  Location: [place];  Service: Orthopedics;  Laterality: N/A;  . PROSTATECTOMY  [address] [facility]-Dr. Fredric Dine, MD 05/13/17 [phone]

## 2017-07-08 ENCOUNTER — Telehealth (INDEPENDENT_AMBULATORY_CARE_PROVIDER_SITE_OTHER): Payer: Self-pay | Admitting: *Deleted

## 2017-07-08 ENCOUNTER — Encounter (INDEPENDENT_AMBULATORY_CARE_PROVIDER_SITE_OTHER): Payer: Self-pay | Admitting: *Deleted

## 2017-07-08 MED ORDER — PEG 3350-KCL-NA BICARB-NACL 420 G PO SOLR: 4000.0000 mL | Freq: Once | ORAL | 0 refills | Status: AC

## 2017-07-08 NOTE — Telephone Encounter (Signed)
agree

## 2017-07-08 NOTE — Telephone Encounter (Signed)
Referring MD/PCP: golding   Procedure: tcs  Reason/Indication:  Hx polyps, fam hx colon ca  Has patient had this procedure before?  Yes, 2013  If so, when, by whom and where?    Is there a family history of colon cancer?  Yes, brother  Who?  What age when diagnosed?    Is patient diabetic?   no      Does patient have prosthetic heart valve or mechanical valve?  no  Do you have a pacemaker?  no  Has patient ever had endocarditis? no  Has patient had joint replacement within last 12 months?  no  Is patient constipated or do they take laxatives? no  Does patient have a history of alcohol/drug use?  no  Is patient on blood thinner such as Coumadin, Plavix and/or Aspirin? no  Medications: none  Allergies: codeine  Medication Adjustment per Dr Lindi Adie, NP:   Procedure date & time: 08/07/17 at 145

## 2017-07-08 NOTE — Telephone Encounter (Signed)
Patient needs trilyte 

## 2017-08-07 ENCOUNTER — Encounter (HOSPITAL_COMMUNITY)
Admission: RE | Disposition: A | Discharge status: Home / Self Care | Payer: Self-pay | Source: Ambulatory Visit | Attending: Internal Medicine

## 2017-08-07 ENCOUNTER — Ambulatory Visit (HOSPITAL_COMMUNITY)
Admission: RE | Admit: 2017-08-07 | Discharge: 2017-08-07 | Disposition: A | Discharge status: Home / Self Care | Payer: 59 | Source: Ambulatory Visit | Attending: Internal Medicine | Admitting: Internal Medicine

## 2017-08-07 ENCOUNTER — Other Ambulatory Visit: Payer: Self-pay

## 2017-08-07 ENCOUNTER — Encounter (HOSPITAL_COMMUNITY): Payer: Self-pay

## 2017-08-07 DIAGNOSIS — K648 Other hemorrhoids: Secondary | ICD-10-CM | POA: Diagnosis not present

## 2017-08-07 DIAGNOSIS — Z860100 Personal history of colon polyps, unspecified: Secondary | ICD-10-CM | POA: Insufficient documentation

## 2017-08-07 DIAGNOSIS — K621 Rectal polyp: Secondary | ICD-10-CM | POA: Diagnosis not present

## 2017-08-07 DIAGNOSIS — Z87891 Personal history of nicotine dependence: Secondary | ICD-10-CM | POA: Insufficient documentation

## 2017-08-07 DIAGNOSIS — Z800 Family history of malignant neoplasm of digestive organs: Secondary | ICD-10-CM | POA: Diagnosis not present

## 2017-08-07 DIAGNOSIS — Z1211 Encounter for screening for malignant neoplasm of colon: Secondary | ICD-10-CM | POA: Insufficient documentation

## 2017-08-07 DIAGNOSIS — Z09 Encounter for follow-up examination after completed treatment for conditions other than malignant neoplasm: Secondary | ICD-10-CM | POA: Diagnosis not present

## 2017-08-07 DIAGNOSIS — Z885 Allergy status to narcotic agent status: Secondary | ICD-10-CM | POA: Insufficient documentation

## 2017-08-07 DIAGNOSIS — Z8546 Personal history of malignant neoplasm of prostate: Secondary | ICD-10-CM | POA: Insufficient documentation

## 2017-08-07 DIAGNOSIS — D120 Benign neoplasm of cecum: Secondary | ICD-10-CM | POA: Diagnosis not present

## 2017-08-07 HISTORY — PX: POLYPECTOMY: SHX5525

## 2017-08-07 HISTORY — PX: COLONOSCOPY: SHX5424

## 2017-08-07 SURGERY — COLONOSCOPY
Anesthesia: Moderate Sedation

## 2017-08-07 MED ORDER — MEPERIDINE HCL 50 MG/ML IJ SOLN
INTRAMUSCULAR | Status: DC | PRN
  Administered 2017-08-07 (×2): 25 mg via INTRAVENOUS

## 2017-08-07 MED ORDER — STERILE WATER FOR IRRIGATION IR SOLN
Status: DC | PRN
  Administered 2017-08-07: 15 mL

## 2017-08-07 MED ORDER — MIDAZOLAM HCL 5 MG/5ML IJ SOLN
INTRAMUSCULAR | Status: DC | PRN
  Administered 2017-08-07: 2 mg via INTRAVENOUS
  Administered 2017-08-07: 1 mg via INTRAVENOUS
  Administered 2017-08-07: 2 mg via INTRAVENOUS
  Administered 2017-08-07: 1 mg via INTRAVENOUS

## 2017-08-07 MED ORDER — MIDAZOLAM HCL 5 MG/5ML IJ SOLN
INTRAMUSCULAR | Status: AC
  Filled 2017-08-07: qty 10

## 2017-08-07 MED ORDER — MEPERIDINE HCL 50 MG/ML IJ SOLN
INTRAMUSCULAR | Status: AC
  Filled 2017-08-07: qty 1

## 2017-08-07 MED ORDER — SODIUM CHLORIDE 0.9 % IV SOLN
INTRAVENOUS | Status: DC
  Administered 2017-08-07: 13:00:00 via INTRAVENOUS

## 2017-08-07 NOTE — H&P (Signed)
[name] is an 67 y.o. male.   Chief Complaint: Patient is here for colonoscopy. HPI: Patient is 67 year old Afro-American male with history of colonic adenomas and is here for surveillance colonoscopy.  He denies abdominal pain change in bowel habits or rectal bleeding. Last colonoscopy was in August 2013 with removal of 10 mm tubular adenoma.  He had an adenoma removed on prior colonoscopy of 2008. Personal history significant for prostate CA for which she had radical prostatectomy. Family history significant for CRC and his brother who was 65 at the time of diagnosis.  Family history is also significant for non-GI malignancies.  His brother just died of lung carcinoma at 15.  Past Medical History:  Diagnosis Date  . Cancer [facility])    prostate  . Fracture of finger of right hand    4th and 5 th digit  . Knee fracture     Past Surgical History:  Procedure Laterality Date  . COLONOSCOPY  11/28/2011   Procedure: COLONOSCOPY;  Surgeon: Rogene Houston, MD;  [facility];  Service: Endoscopy;  Laterality: N/A;  930  . PERCUTANEOUS PINNING N/A 02/12/2014   Procedure: CLOSED REDUCTION PERCUTANEOUS PINNING 4TH AND 5TH FINGERS;  Surgeon: Marianna Payment, MD;  Location: [place];  Service: Orthopedics;  Laterality: N/A;  . PROSTATECTOMY  2005   [facility]-Dr. Michela Pitcher    Family History  Problem Relation Age of Onset  . Colon cancer Brother   . Asthma Unknown   . Diabetes Unknown   . Arthritis Unknown    Social History:  reports that he has quit smoking. His smoking use included cigarettes. He has a 45.00 pack-year smoking history. He has never used smokeless tobacco. He reports that he drinks about 12.0 oz of alcohol per week. He reports that he has current or past drug history. Drug: Marijuana.  Allergies:  Allergies  Allergen Reactions  . Codeine Itching    No medications prior to admission.    No results found for this or any previous visit (from the past 48  hour(s)). No results found.  ROS  Blood pressure (!) 120/91, pulse 73, temperature 98.1 F (36.7 C), temperature source Oral, resp. rate 17, height 6\' 2"  (1.88 m), weight 178 lb (80.7 kg), SpO2 100 %. Physical Exam  Constitutional: He appears well-developed and well-nourished.  HENT:  Mouth/Throat: Oropharynx is clear and moist.  Eyes: Conjunctivae are normal. No scleral icterus.  Neck: No thyromegaly present.  Cardiovascular: Normal rate, regular rhythm and normal heart sounds.  No murmur heard. Respiratory: Effort normal and breath sounds normal.  GI:  Lower midline scar.  Abdomen is soft and nontender with organomegaly or masses.  Musculoskeletal: He exhibits no edema.  Lymphadenopathy:    He has no cervical adenopathy.  Skin: Skin is warm and dry.     Assessment/Plan History of colonic adenomas. Surveillance colonoscopy.  Hildred Laser, MD 08/07/2017, 1:44 PM

## 2017-08-07 NOTE — Discharge Instructions (Signed)
No aspirin or NSAIDs for 24 hours. Resume usual diet. No driving for 24 hours. Patient will call with biopsy results. Next colonoscopy in 5 years.     Colonoscopy, Adult, Care After This sheet gives you information about how to care for yourself after your procedure. Your doctor may also give you more specific instructions. If you have problems or questions, call your doctor. Follow these instructions at home: General instructions   For the first 24 hours after the procedure: ? Do not drive or use machinery. ? Do not sign important documents. ? Do not drink alcohol. ? Do your daily activities more slowly than normal. ? Eat foods that are soft and easy to digest. ? Rest often.  Take over-the-counter or prescription medicines only as told by your doctor.  It is up to you to get the results of your procedure. Ask your doctor, or the department performing the procedure, when your results will be ready. To help cramping and bloating:  Try walking around.  Put heat on your belly (abdomen) as told by your doctor. Use a heat source that your doctor recommends, such as a moist heat pack or a heating pad. ? Put a towel between your skin and the heat source. ? Leave the heat on for 20-30 minutes. ? Remove the heat if your skin turns bright red. This is especially important if you cannot feel pain, heat, or cold. You can get burned. Eating and drinking  Drink enough fluid to keep your pee (urine) clear or pale yellow.  Return to your normal diet as told by your doctor. Avoid heavy or fried foods that are hard to digest.  Avoid drinking alcohol for as long as told by your doctor. Contact a doctor if:  You have blood in your poop (stool) 2-3 days after the procedure. Get help right away if:  You have more than a small amount of blood in your poop.  You see large clumps of tissue (blood clots) in your poop.  Your belly is swollen.  You feel sick to your stomach (nauseous).  You  throw up (vomit).  You have a fever.  You have belly pain that gets worse, and medicine does not help your pain. This information is not intended to replace advice given to you by your health care provider. Make sure you discuss any questions you have with your health care provider. Document Released: 04/28/2010 Document Revised: 12/19/2015 Document Reviewed: 12/19/2015 Elsevier Interactive Patient Education  2017 [employer].    Colon Polyps Polyps are tissue growths inside the body. Polyps can grow in many places, including the large intestine (colon). A polyp may be a round bump or a mushroom-shaped growth. You could have one polyp or several. Most colon polyps are noncancerous (benign). However, some colon polyps can become cancerous over time. What are the causes? The exact cause of colon polyps is not known. What increases the risk? This condition is more likely to develop in people who:  Have a family history of colon cancer or colon polyps.  Are older than 16 or older than 45 if they are African American.  Have inflammatory bowel disease, such as ulcerative colitis or Crohn disease.  Are overweight.  Smoke cigarettes.  Do not get enough exercise.  Drink too much alcohol.  Eat a diet that is: ? High in fat and red meat. ? Low in fiber.  Had childhood cancer that was treated with abdominal radiation.  What are the signs or symptoms? Most  polyps do not cause symptoms. If you have symptoms, they may include: °· Blood coming from your rectum when having a bowel movement. °· Blood in your stool. The stool may look dark red or black. °· A change in bowel habits, such as constipation or diarrhea. ° °How is this diagnosed? °This condition is diagnosed with a colonoscopy. This is a procedure that uses a lighted, flexible scope to look at the inside of your colon. °How is this treated? °Treatment for this condition involves removing any polyps that are found. Those polyps will  then be tested for cancer. If cancer is found, your health care provider will talk to you about options for colon cancer treatment. °Follow these instructions at home: °Diet °· Eat plenty of fiber, such as fruits, vegetables, and whole grains. °· Eat foods that are high in calcium and vitamin D, such as milk, cheese, yogurt, eggs, liver, fish, and broccoli. °· Limit foods high in fat, red meats, and processed meats, such as hot dogs, sausage, bacon, and lunch meats. °· Maintain a healthy weight, or lose weight if recommended by your health care provider. °General instructions °· Do not smoke cigarettes. °· Do not drink alcohol excessively. °· Keep all follow-up visits as told by your health care provider. This is important. This includes keeping regularly scheduled colonoscopies. Talk to your health care provider about when you need a colonoscopy. °· Exercise every day or as told by your health care provider. °Contact a health care provider if: °· You have new or worsening bleeding during a bowel movement. °· You have new or increased blood in your stool. °· You have a change in bowel habits. °· You unexpectedly lose weight. °This information is not intended to replace advice given to you by your health care provider. Make sure you discuss any questions you have with your health care provider. °Document Released: 12/21/2003 Document Revised: 09/01/2015 Document Reviewed: 02/14/2015 °Elsevier Interactive Patient Education © 2018 [employer] ° °

## 2017-08-07 NOTE — Op Note (Signed)
[facility] Patient Name: [name] Procedure Date: 08/07/2017 1:42 PM MRN: [id number] Date of Birth: Nov 10, 1950 Attending MD: Hildred Laser , MD CSN: [id number] Age: 67 Admit Type: Outpatient Procedure:                Colonoscopy Indications:              High risk colon cancer surveillance: Personal                            history of colonic polyps, Family history of colon                            cancer in a first-degree relative Providers:                Hildred Laser, MD, Charlsie Quest. Theda Sers RN, RN, Nelma Rothman, Technician Referring MD:             Halford Chessman, MD Medicines:                Meperidine 50 mg IV, Midazolam 6 mg IV Complications:            No immediate complications. Estimated Blood Loss:     Estimated blood loss was minimal. Procedure:                Pre-Anesthesia Assessment:                           - Prior to the procedure, a History and Physical                            was performed, and patient medications and                            allergies were reviewed. The patient's tolerance of                            previous anesthesia was also reviewed. The risks                            and benefits of the procedure and the sedation                            options and risks were discussed with the patient.                            All questions were answered, and informed consent                            was obtained. Prior Anticoagulants: The patient has                            taken no previous anticoagulant or antiplatelet  agents. ASA Grade Assessment: I - A normal, healthy                            patient. After reviewing the risks and benefits,                            the patient was deemed in satisfactory condition to                            undergo the procedure.                           After obtaining informed consent, the colonoscope   was passed under direct vision. Throughout the                            procedure, the patient's blood pressure, pulse, and                            oxygen saturations were monitored continuously. The                            EC-[id number] ([id number]) scope was introduced through                            the anus and advanced to the the cecum, identified                            by appendiceal orifice and ileocecal valve. The                            colonoscopy was performed without difficulty. The                            patient tolerated the procedure well. The quality                            of the bowel preparation was excellent. The                            ileocecal valve, appendiceal orifice, and rectum                            were photographed. Scope In: 1:57:26 PM Scope Out: 2:24:44 PM Scope Withdrawal Time: 0 hours 21 minutes 9 seconds  Total Procedure Duration: 0 hours 27 minutes 18 seconds  Findings:      The perianal and digital rectal examinations were normal.      A small polyp was found in the rectum cecum. The polyp was sessile.       Biopsies were taken with a cold forceps for histology. The pathology       specimen was placed into Bottle Number 1.      Internal hemorrhoids were found during retroflexion. The hemorrhoids       were medium-sized. Impression:               -  One small polyp in the rectum in the cecum.                            Biopsied.                           - Internal hemorrhoids. Moderate Sedation:      Moderate (conscious) sedation was administered by the endoscopy nurse       and supervised by the endoscopist. The following parameters were       monitored: oxygen saturation, heart rate, blood pressure, CO2       capnography and response to care. Total physician intraservice time was       35 minutes. Recommendation:           - Patient has a contact number available for                            emergencies. The signs and  symptoms of potential                            delayed complications were discussed with the                            patient. Return to normal activities tomorrow.                            Written discharge instructions were provided to the                            patient.                           - Resume previous diet today.                           - Continue present medications.                           - No aspirin, ibuprofen, naproxen, or other                            non-steroidal anti-inflammatory drugs for 1 day.                           - Await pathology results.                           - Repeat colonoscopy in 5 years for surveillance. Procedure Code(s):        --- Professional ---                           [phone], Colonoscopy, flexible; with biopsy, single                            or multiple  G0500, Moderate sedation services provided by the                            same physician or other qualified health care                            professional performing a gastrointestinal                            endoscopic service that sedation supports,                            requiring the presence of an independent trained                            observer to assist in the monitoring of the                            patient's level of consciousness and physiological                            status; initial 15 minutes of intra-service time;                            patient age 71 years or older (additional time may                            be reported with [phone], as appropriate)                           [phone], Moderate sedation services provided by the                            same physician or other qualified health care                            professional performing the diagnostic or                            therapeutic service that the sedation supports,                            requiring the presence of an  independent trained                            observer to assist in the monitoring of the                            patient's level of consciousness and physiological                            status; each additional 15 minutes intraservice  time (List separately in addition to code for                            primary service) Diagnosis Code(s):        --- Professional ---                           Z86.010, Personal history of colonic polyps                           K62.1, Rectal polyp                           D12.0, Benign neoplasm of cecum                           K64.8, Other hemorrhoids                           Z80.0, Family history of malignant neoplasm of                            digestive organs CPT copyright 2017 [employer]. All rights reserved. The codes documented in this report are preliminary and upon coder review may  be revised to meet current compliance requirements. Hildred Laser, MD Hildred Laser, MD 08/07/2017 2:36:12 PM This report has been signed electronically. Number of Addenda: 0

## 2017-08-13 ENCOUNTER — Encounter (HOSPITAL_COMMUNITY): Payer: Self-pay | Admitting: Internal Medicine

## 2018-04-17 DIAGNOSIS — R001 Bradycardia, unspecified: Secondary | ICD-10-CM | POA: Diagnosis not present

## 2018-04-17 DIAGNOSIS — E663 Overweight: Secondary | ICD-10-CM | POA: Diagnosis not present

## 2018-04-17 DIAGNOSIS — Z125 Encounter for screening for malignant neoplasm of prostate: Secondary | ICD-10-CM | POA: Diagnosis not present

## 2018-04-17 DIAGNOSIS — Z1389 Encounter for screening for other disorder: Secondary | ICD-10-CM | POA: Diagnosis not present

## 2018-04-17 DIAGNOSIS — C61 Malignant neoplasm of prostate: Secondary | ICD-10-CM | POA: Diagnosis not present

## 2018-04-17 DIAGNOSIS — Z6825 Body mass index (BMI) 25.0-25.9, adult: Secondary | ICD-10-CM | POA: Diagnosis not present

## 2018-04-17 DIAGNOSIS — Z0001 Encounter for general adult medical examination with abnormal findings: Secondary | ICD-10-CM | POA: Diagnosis not present

## 2018-04-17 DIAGNOSIS — R51 Headache: Secondary | ICD-10-CM | POA: Diagnosis not present

## 2018-04-17 DIAGNOSIS — R7309 Other abnormal glucose: Secondary | ICD-10-CM | POA: Diagnosis not present

## 2018-04-17 DIAGNOSIS — E782 Mixed hyperlipidemia: Secondary | ICD-10-CM | POA: Diagnosis not present

## 2018-04-22 ENCOUNTER — Other Ambulatory Visit (HOSPITAL_COMMUNITY): Payer: Self-pay | Admitting: Family Medicine

## 2018-04-22 ENCOUNTER — Other Ambulatory Visit: Payer: Self-pay | Admitting: Family Medicine

## 2018-04-22 DIAGNOSIS — F17208 Nicotine dependence, unspecified, with other nicotine-induced disorders: Secondary | ICD-10-CM

## 2018-05-09 ENCOUNTER — Ambulatory Visit (HOSPITAL_COMMUNITY)
Admission: RE | Admit: 2018-05-09 | Discharge: 2018-05-09 | Disposition: A | Discharge status: Home / Self Care | Payer: Medicare Other | Source: Ambulatory Visit | Attending: Family Medicine | Admitting: Family Medicine

## 2018-05-09 DIAGNOSIS — F17208 Nicotine dependence, unspecified, with other nicotine-induced disorders: Secondary | ICD-10-CM | POA: Insufficient documentation

## 2018-05-09 DIAGNOSIS — Z87891 Personal history of nicotine dependence: Secondary | ICD-10-CM | POA: Diagnosis not present

## 2018-08-01 DIAGNOSIS — Z1389 Encounter for screening for other disorder: Secondary | ICD-10-CM | POA: Diagnosis not present

## 2018-08-01 DIAGNOSIS — K219 Gastro-esophageal reflux disease without esophagitis: Secondary | ICD-10-CM | POA: Diagnosis not present

## 2018-08-01 DIAGNOSIS — Z6825 Body mass index (BMI) 25.0-25.9, adult: Secondary | ICD-10-CM | POA: Diagnosis not present

## 2018-08-01 DIAGNOSIS — E663 Overweight: Secondary | ICD-10-CM | POA: Diagnosis not present

## 2018-08-18 ENCOUNTER — Emergency Department (HOSPITAL_COMMUNITY): Payer: Medicare Other

## 2018-08-18 ENCOUNTER — Encounter (HOSPITAL_COMMUNITY): Payer: Self-pay | Admitting: Emergency Medicine

## 2018-08-18 ENCOUNTER — Other Ambulatory Visit: Payer: Self-pay

## 2018-08-18 ENCOUNTER — Emergency Department (HOSPITAL_COMMUNITY)
Admission: EM | Admit: 2018-08-18 | Discharge: 2018-08-18 | Disposition: A | Discharge status: Home / Self Care | Payer: Medicare Other | Attending: Emergency Medicine | Admitting: Emergency Medicine

## 2018-08-18 DIAGNOSIS — Y92410 Unspecified street and highway as the place of occurrence of the external cause: Secondary | ICD-10-CM | POA: Diagnosis not present

## 2018-08-18 DIAGNOSIS — S161XXA Strain of muscle, fascia and tendon at neck level, initial encounter: Secondary | ICD-10-CM | POA: Diagnosis not present

## 2018-08-18 DIAGNOSIS — Z87891 Personal history of nicotine dependence: Secondary | ICD-10-CM | POA: Diagnosis not present

## 2018-08-18 DIAGNOSIS — R1084 Generalized abdominal pain: Secondary | ICD-10-CM | POA: Diagnosis not present

## 2018-08-18 DIAGNOSIS — Z79899 Other long term (current) drug therapy: Secondary | ICD-10-CM | POA: Diagnosis not present

## 2018-08-18 DIAGNOSIS — R52 Pain, unspecified: Secondary | ICD-10-CM | POA: Diagnosis not present

## 2018-08-18 DIAGNOSIS — Y9389 Activity, other specified: Secondary | ICD-10-CM | POA: Diagnosis not present

## 2018-08-18 DIAGNOSIS — M542 Cervicalgia: Secondary | ICD-10-CM | POA: Diagnosis not present

## 2018-08-18 DIAGNOSIS — S161XXS Strain of muscle, fascia and tendon at neck level, sequela: Secondary | ICD-10-CM | POA: Diagnosis not present

## 2018-08-18 DIAGNOSIS — S199XXA Unspecified injury of neck, initial encounter: Secondary | ICD-10-CM | POA: Diagnosis not present

## 2018-08-18 DIAGNOSIS — Y999 Unspecified external cause status: Secondary | ICD-10-CM | POA: Insufficient documentation

## 2018-08-18 MED ORDER — METHOCARBAMOL 500 MG PO TABS: 500.0000 mg | ORAL_TABLET | Freq: Three times a day (TID) | ORAL | 0 refills | Status: DC

## 2018-08-18 MED ORDER — ACETAMINOPHEN 325 MG PO TABS
650.0000 mg | ORAL_TABLET | Freq: Once | ORAL | Status: AC
  Administered 2018-08-18: 650 mg via ORAL
  Filled 2018-08-18: qty 2

## 2018-08-18 MED ORDER — TRAMADOL HCL 50 MG PO TABS: 50.0000 mg | ORAL_TABLET | Freq: Four times a day (QID) | ORAL | 0 refills | Status: DC | PRN
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
## 2018-08-18 NOTE — Discharge Instructions (Addendum)
Apply ice packs on/off to your neck tonight.  Follow-up with your primary provider for recheck.  Return here for any worsening symptoms
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
## 2018-08-18 NOTE — ED Notes (Signed)
Pt was given ice water

## 2018-08-21 DIAGNOSIS — Z6824 Body mass index (BMI) 24.0-24.9, adult: Secondary | ICD-10-CM | POA: Diagnosis not present

## 2018-08-21 DIAGNOSIS — M542 Cervicalgia: Secondary | ICD-10-CM | POA: Diagnosis not present

## 2018-08-21 DIAGNOSIS — Z1389 Encounter for screening for other disorder: Secondary | ICD-10-CM | POA: Diagnosis not present

## 2018-09-11 DIAGNOSIS — R7309 Other abnormal glucose: Secondary | ICD-10-CM | POA: Diagnosis not present

## 2018-09-11 DIAGNOSIS — Z6824 Body mass index (BMI) 24.0-24.9, adult: Secondary | ICD-10-CM | POA: Diagnosis not present

## 2018-09-11 DIAGNOSIS — M25511 Pain in right shoulder: Secondary | ICD-10-CM | POA: Diagnosis not present

## 2018-10-21 DIAGNOSIS — Z6825 Body mass index (BMI) 25.0-25.9, adult: Secondary | ICD-10-CM | POA: Diagnosis not present

## 2018-10-21 DIAGNOSIS — K279 Peptic ulcer, site unspecified, unspecified as acute or chronic, without hemorrhage or perforation: Secondary | ICD-10-CM | POA: Diagnosis not present

## 2018-10-21 DIAGNOSIS — K219 Gastro-esophageal reflux disease without esophagitis: Secondary | ICD-10-CM | POA: Diagnosis not present

## 2018-10-21 DIAGNOSIS — E663 Overweight: Secondary | ICD-10-CM | POA: Diagnosis not present

## 2018-10-30 ENCOUNTER — Encounter (INDEPENDENT_AMBULATORY_CARE_PROVIDER_SITE_OTHER): Payer: Self-pay | Admitting: *Deleted

## 2018-10-30 ENCOUNTER — Other Ambulatory Visit: Payer: Self-pay

## 2018-10-30 ENCOUNTER — Encounter (INDEPENDENT_AMBULATORY_CARE_PROVIDER_SITE_OTHER): Payer: Self-pay | Admitting: Internal Medicine

## 2018-10-30 ENCOUNTER — Ambulatory Visit (INDEPENDENT_AMBULATORY_CARE_PROVIDER_SITE_OTHER): Payer: Medicare Other | Admitting: Internal Medicine

## 2018-10-30 VITALS — BP 108/75 | HR 85 | Temp 97.9°F | Ht 74.0 in | Wt 180.6 lb

## 2018-10-30 DIAGNOSIS — R1013 Epigastric pain: Secondary | ICD-10-CM | POA: Diagnosis not present

## 2018-10-30 DIAGNOSIS — K219 Gastro-esophageal reflux disease without esophagitis: Secondary | ICD-10-CM

## 2018-10-30 MED ORDER — RABEPRAZOLE SODIUM 20 MG PO TBEC: 20.0000 mg | DELAYED_RELEASE_TABLET | Freq: Every day | ORAL | 3 refills | Status: DC

## 2018-10-30 NOTE — Patient Instructions (Signed)
The risks of bleeding, perforation and infection were reviewed with patient.  

## 2018-10-30 NOTE — Progress Notes (Addendum)
Subjective:    Patient ID: [name], male    DOB: 1950/07/28, 68 y.o.   MRN: [id number]  HPI Referred by Dr. Hilma Favors for GERD/EGD. Hx significant for family hx of colon cancer, colonic polyps. He tells me he has been passing a lot of flatus. Also sees a dark streak in his stool. He says when he see the dark streak in his stool, he will stop drinking.  He is not a heavy drinker. He sometimes has pain under his ribs and feels like he is swollen in his epigastric region. Cannot really tell me if the Aciphex is helping. He does say the goat milk is helping. If he eats fried foods, he will have epigastric pain and bloating.  His appetite is good. No weight loss.  BMs for the most part are normal. . Since he stop eating pork, he has a BM every other day.  Patient would like to proceed with an EGD.  States he had an EGD years ago by Dr. Humphrey Rolls and was told he had an ulcer.  Last colonoscopy was in May of 2019.    10/21/2018 H. Pylori negative.   Past Medical History:  Diagnosis Date  . Cancer [facility])    prostate  . Fracture of finger of right hand    4th and 5 th digit  . Knee fracture     Past Surgical History:  Procedure Laterality Date  . COLONOSCOPY  11/28/2011   Procedure: COLONOSCOPY;  Surgeon: Rogene Houston, MD;  [facility];  Service: Endoscopy;  Laterality: N/A;  930  . COLONOSCOPY N/A 08/07/2017   Procedure: COLONOSCOPY;  Surgeon: Rogene Houston, MD;  [facility];  Service: Endoscopy;  Laterality: N/A;  100  . PERCUTANEOUS PINNING N/A 02/12/2014   Procedure: CLOSED REDUCTION PERCUTANEOUS PINNING 4TH AND 5TH FINGERS;  Surgeon: Marianna Payment, MD;  Location: [place];  Service: Orthopedics;  Laterality: N/A;  . POLYPECTOMY  08/07/2017   Procedure: POLYPECTOMY;  Surgeon: Rogene Houston, MD;  [facility];  Service: Endoscopy;;  . PROSTATECTOMY  2005    Hospital-Dr. Javaid    Allergies  Allergen Reactions  . Codeine Itching     Current Outpatient Medications on File Prior to Visit  Medication Sig Dispense Refill  . methocarbamol (ROBAXIN) 500 MG tablet Take 1 tablet (500 mg total) by mouth 3 (three) times daily. (Patient not taking: Reported on 10/30/2018) 21 tablet 0  . tadalafil (CIALIS) 5 MG tablet Take 5-15 mg by mouth daily as needed for erectile dysfunction.     . traMADol (ULTRAM) 50 MG tablet Take 1 tablet (50 mg total) by mouth every 6 (six) hours as needed. (Patient not taking: Reported on 10/30/2018) 10 tablet 0   No current facility-administered medications on file prior to visit.          Objective:   Physical Exam Blood pressure 108/75, pulse 85, temperature 97.9 F (36.6 C), height 6\' 2"  (1.88 m), weight 180 lb 9.6 oz (81.9 kg). Alert and oriented. Skin warm and dry. Oral mucosa is moist.   . Sclera anicteric, conjunctivae is pink. Thyroid not enlarged. No cervical lymphadenopathy. Lungs clear. Heart regular rate and rhythm.  Abdomen is soft. Bowel sounds are positive. No hepatomegaly. No abdominal masses felt. No tenderness.  No edema to lower extremities.        Assessment & Plan:  GERD. Will proceed with an EGD to make sure he does not have  PUD. If normal, may need to look at [facility]. Rx for Aciphex sent to his pharmacy.  Will also get an US abdomen.

## 2018-11-04 DIAGNOSIS — Z6824 Body mass index (BMI) 24.0-24.9, adult: Secondary | ICD-10-CM | POA: Diagnosis not present

## 2018-11-04 DIAGNOSIS — L723 Sebaceous cyst: Secondary | ICD-10-CM | POA: Diagnosis not present

## 2018-11-05 ENCOUNTER — Ambulatory Visit (HOSPITAL_COMMUNITY)
Admission: RE | Admit: 2018-11-05 | Discharge: 2018-11-05 | Disposition: A | Discharge status: Home / Self Care | Payer: Medicare Other | Source: Ambulatory Visit | Attending: Internal Medicine | Admitting: Internal Medicine

## 2018-11-05 ENCOUNTER — Other Ambulatory Visit: Payer: Self-pay

## 2018-11-05 DIAGNOSIS — R1013 Epigastric pain: Secondary | ICD-10-CM | POA: Diagnosis not present

## 2018-11-25 ENCOUNTER — Other Ambulatory Visit (HOSPITAL_COMMUNITY)
Admission: RE | Admit: 2018-11-25 | Discharge: 2018-11-25 | Disposition: A | Discharge status: Home / Self Care | Payer: Medicare Other | Source: Ambulatory Visit | Attending: Internal Medicine | Admitting: Internal Medicine

## 2018-11-25 ENCOUNTER — Other Ambulatory Visit: Payer: Self-pay

## 2018-11-25 DIAGNOSIS — Z20828 Contact with and (suspected) exposure to other viral communicable diseases: Secondary | ICD-10-CM | POA: Insufficient documentation

## 2018-11-25 DIAGNOSIS — Z01812 Encounter for preprocedural laboratory examination: Secondary | ICD-10-CM | POA: Diagnosis not present

## 2018-11-25 LAB — SARS CORONAVIRUS 2 (TAT 6-24 HRS): SARS Coronavirus 2: NEGATIVE

## 2018-11-27 ENCOUNTER — Encounter (HOSPITAL_COMMUNITY): Payer: Self-pay | Admitting: *Deleted

## 2018-11-27 ENCOUNTER — Encounter (HOSPITAL_COMMUNITY)
Admission: RE | Disposition: A | Discharge status: Home / Self Care | Payer: Self-pay | Source: Home / Self Care | Attending: Internal Medicine

## 2018-11-27 ENCOUNTER — Other Ambulatory Visit: Payer: Self-pay

## 2018-11-27 ENCOUNTER — Ambulatory Visit (HOSPITAL_COMMUNITY)
Admission: RE | Admit: 2018-11-27 | Discharge: 2018-11-27 | Disposition: A | Discharge status: Home / Self Care | Payer: Medicare Other | Attending: Internal Medicine | Admitting: Internal Medicine

## 2018-11-27 DIAGNOSIS — K449 Diaphragmatic hernia without obstruction or gangrene: Secondary | ICD-10-CM | POA: Insufficient documentation

## 2018-11-27 DIAGNOSIS — K219 Gastro-esophageal reflux disease without esophagitis: Secondary | ICD-10-CM | POA: Diagnosis not present

## 2018-11-27 DIAGNOSIS — K222 Esophageal obstruction: Secondary | ICD-10-CM | POA: Diagnosis not present

## 2018-11-27 DIAGNOSIS — R1013 Epigastric pain: Secondary | ICD-10-CM | POA: Diagnosis not present

## 2018-11-27 DIAGNOSIS — Z79899 Other long term (current) drug therapy: Secondary | ICD-10-CM | POA: Insufficient documentation

## 2018-11-27 DIAGNOSIS — Z87891 Personal history of nicotine dependence: Secondary | ICD-10-CM | POA: Diagnosis not present

## 2018-11-27 DIAGNOSIS — Z8711 Personal history of peptic ulcer disease: Secondary | ICD-10-CM | POA: Diagnosis not present

## 2018-11-27 HISTORY — PX: ESOPHAGOGASTRODUODENOSCOPY: SHX5428

## 2018-11-27 SURGERY — EGD (ESOPHAGOGASTRODUODENOSCOPY)
Anesthesia: Moderate Sedation

## 2018-11-27 MED ORDER — SODIUM CHLORIDE 0.9 % IV SOLN
INTRAVENOUS | Status: DC
  Administered 2018-11-27: 15:00:00 via INTRAVENOUS

## 2018-11-27 MED ORDER — MIDAZOLAM HCL 5 MG/5ML IJ SOLN
INTRAMUSCULAR | Status: AC
  Filled 2018-11-27: qty 10

## 2018-11-27 MED ORDER — STERILE WATER FOR IRRIGATION IR SOLN
Status: DC | PRN
  Administered 2018-11-27: 100 mL

## 2018-11-27 MED ORDER — MEPERIDINE HCL 50 MG/ML IJ SOLN
INTRAMUSCULAR | Status: DC | PRN
  Administered 2018-11-27 (×2): 25 mg via INTRAVENOUS

## 2018-11-27 MED ORDER — PANTOPRAZOLE SODIUM 40 MG PO TBEC: 40.0000 mg | DELAYED_RELEASE_TABLET | Freq: Every day | ORAL | 5 refills | Status: DC

## 2018-11-27 MED ORDER — LIDOCAINE VISCOUS HCL 2 % MT SOLN
OROMUCOSAL | Status: AC
  Filled 2018-11-27: qty 15

## 2018-11-27 MED ORDER — MIDAZOLAM HCL 5 MG/5ML IJ SOLN
INTRAMUSCULAR | Status: DC | PRN
  Administered 2018-11-27 (×2): 2 mg via INTRAVENOUS
  Administered 2018-11-27: 1 mg via INTRAVENOUS

## 2018-11-27 MED ORDER — MEPERIDINE HCL 50 MG/ML IJ SOLN
INTRAMUSCULAR | Status: AC
  Filled 2018-11-27: qty 1

## 2018-11-27 NOTE — Discharge Instructions (Signed)
Discontinued rabeprazole. Pantoprazole 40 mg by mouth 30 minutes before breakfast daily. Resume other medications as before. Resume usual diet. No driving for 24 hours. Abdominal CT to be scheduled.  Office will call.   Upper Endoscopy, Adult, Care After This sheet gives you information about how to care for yourself after your procedure. Your health care provider may also give you more specific instructions. If you have problems or questions, contact your health care provider. What can I expect after the procedure? After the procedure, it is common to have:  A sore throat.  Mild stomach pain or discomfort.  Bloating.  Nausea. Follow these instructions at home:   Follow instructions from your health care provider about what to eat or drink after your procedure.  Return to your normal activities as told by your health care provider. Ask your health care provider what activities are safe for you.  Take over-the-counter and prescription medicines only as told by your health care provider.  Do not drive for 24 hours if you were given a sedative during your procedure.  Keep all follow-up visits as told by your health care provider. This is important. Contact a health care provider if you have:  A sore throat that lasts longer than one day.  Trouble swallowing. Get help right away if:  You vomit blood or your vomit looks like coffee grounds.  You have: ? A fever. ? Bloody, black, or tarry stools. ? A severe sore throat or you cannot swallow. ? Difficulty breathing. ? Severe pain in your chest or abdomen. Summary  After the procedure, it is common to have a sore throat, mild stomach discomfort, bloating, and nausea.  Do not drive for 24 hours if you were given a sedative during the procedure.  Follow instructions from your health care provider about what to eat or drink after your procedure.  Return to your normal activities as told by your health care provider. This  information is not intended to replace advice given to you by your health care provider. Make sure you discuss any questions you have with your health care provider. Document Released: 09/25/2011 Document Revised: 09/17/2017 Document Reviewed: 08/26/2017 Elsevier Patient Education  2020 [employer].  Hiatal Hernia  A hiatal hernia occurs when part of the stomach slides above the muscle that separates the abdomen from the chest (diaphragm). A person can be born with a hiatal hernia (congenital), or it may develop over time. In almost all cases of hiatal hernia, only the top part of the stomach pushes through the diaphragm. Many people have a hiatal hernia with no symptoms. The larger the hernia, the more likely it is that you will have symptoms. In some cases, a hiatal hernia allows stomach acid to flow back into the tube that carries food from your mouth to your stomach (esophagus). This may cause heartburn symptoms. Severe heartburn symptoms may mean that you have developed a condition called gastroesophageal reflux disease (GERD). What are the causes? This condition is caused by a weakness in the opening (hiatus) where the esophagus passes through the diaphragm to attach to the upper part of the stomach. A person may be born with a weakness in the hiatus, or a weakness can develop over time. What increases the risk? This condition is more likely to develop in:  Older people. Age is a major risk factor for a hiatal hernia, especially if you are over the age of 26.  Pregnant women.  People who are overweight.  People who  have frequent constipation. What are the signs or symptoms? Symptoms of this condition usually develop in the form of GERD symptoms. Symptoms include:  Heartburn.  Belching.  Indigestion.  Trouble swallowing.  Coughing or wheezing.  Sore throat.  Hoarseness.  Chest pain.  Nausea and vomiting. How is this diagnosed? This condition may be diagnosed during  testing for GERD. Tests that may be done include:  X-rays of your stomach or chest.  An upper gastrointestinal (GI) series. This is an X-ray exam of your GI tract that is taken after you swallow a chalky liquid that shows up clearly on the X-ray.  Endoscopy. This is a procedure to look into your stomach using a thin, flexible tube that has a tiny camera and light on the end of it. How is this treated? This condition may be treated by:  Dietary and lifestyle changes to help reduce GERD symptoms.  Medicines. These may include: ? Over-the-counter antacids. ? Medicines that make your stomach empty more quickly. ? Medicines that block the production of stomach acid (H2 blockers). ? Stronger medicines to reduce stomach acid (proton pump inhibitors).  Surgery to repair the hernia, if other treatments are not helping. If you have no symptoms, you may not need treatment. Follow these instructions at home: Lifestyle and activity  Do not use any products that contain nicotine or tobacco, such as cigarettes and e-cigarettes. If you need help quitting, ask your health care provider.  Try to achieve and maintain a healthy body weight.  Avoid putting pressure on your abdomen. Anything that puts pressure on your abdomen increases the amount of acid that may be pushed up into your esophagus. ? Avoid bending over, especially after eating. ? Raise the head of your bed by putting blocks under the legs. This keeps your head and esophagus higher than your stomach. ? Do not wear tight clothing around your chest or stomach. ? Try not to strain when having a bowel movement, when urinating, or when lifting heavy objects. Eating and drinking  Avoid foods that can worsen GERD symptoms. These may include: ? Fatty foods, like fried foods. ? Citrus fruits, like oranges or lemon. ? Other foods and drinks that contain acid, like orange juice or tomatoes. ? Spicy food. ? Chocolate.  Eat frequent small meals  instead of three large meals a day. This helps prevent your stomach from getting too full. ? Eat slowly. ? Do not lie down right after eating. ? Do not eat 1-2 hours before bed.  Do not drink beverages with caffeine. These include cola, coffee, cocoa, and tea.  Do not drink alcohol. General instructions  Take over-the-counter and prescription medicines only as told by your health care provider.  Keep all follow-up visits as told by your health care provider. This is important. Contact a health care provider if:  Your symptoms are not controlled with medicines or lifestyle changes.  You are having trouble swallowing.  You have coughing or wheezing that will not go away. Get help right away if:  Your pain is getting worse.  Your pain spreads to your arms, neck, jaw, teeth, or back.  You have shortness of breath.  You sweat for no reason.  You feel sick to your stomach (nauseous) or you vomit.  You vomit blood.  You have bright red blood in your stools.  You have black, tarry stools. This information is not intended to replace advice given to you by your health care provider. Make sure you discuss any questions  you have with your health care provider. Document Released: 06/16/2003 Document Revised: 03/08/2017 Document Reviewed: 10/29/2016 Elsevier Patient Education  2020 [employer].

## 2018-11-27 NOTE — Op Note (Signed)
[facility] Patient Name: [name] Procedure Date: 11/27/2018 3:01 PM MRN: [id number] Date of Birth: 26-Jul-1950 Attending MD: Hildred Laser , MD CSN: [id number] Age: 68 Admit Type: Outpatient Procedure:                Upper GI endoscopy Indications:              Epigastric abdominal pain Providers:                Hildred Laser, MD, Otis Peak B. Sharon Seller, RN, Raphael Gibney, Technician Referring MD:             Halford Chessman, MD Medicines:                Lidocaine spray, Meperidine 50 mg IV, Midazolam 5                            mg IV Complications:            No immediate complications. Estimated Blood Loss:     Estimated blood loss: none. Procedure:                Pre-Anesthesia Assessment:                           - Prior to the procedure, a History and Physical                            was performed, and patient medications and                            allergies were reviewed. The patient's tolerance of                            previous anesthesia was also reviewed. The risks                            and benefits of the procedure and the sedation                            options and risks were discussed with the patient.                            All questions were answered, and informed consent                            was obtained. Prior Anticoagulants: The patient has                            taken no previous anticoagulant or antiplatelet                            agents. ASA Grade Assessment: II - A patient with  mild systemic disease. After reviewing the risks                            and benefits, the patient was deemed in                            satisfactory condition to undergo the procedure.                           After obtaining informed consent, the endoscope was                            passed under direct vision. Throughout the                            procedure, the patient's  blood pressure, pulse, and                            oxygen saturations were monitored continuously. The                            [id number]-[id number] ([id number]) scope was introduced through the                            mouth, and advanced to the second part of duodenum.                            The upper GI endoscopy was accomplished without                            difficulty. The patient tolerated the procedure                            well. Scope In: 3:33:02 PM Scope Out: 3:37:44 PM Total Procedure Duration: 0 hours 4 minutes 42 seconds  Findings:      The examined esophagus was normal.      The examined esophagus was normal.      A widely patent Schatzki ring was found at the gastroesophageal junction.      A 3 cm hiatal hernia was present.      The entire examined stomach was normal.      The duodenal bulb and second portion of the duodenum were normal. Impression:               - Normal esophagus.                           - Normal esophagus.                           - Widely patent Schatzki ring.                           - 3 cm hiatal hernia.                           -  Normal stomach.                           - Normal duodenal bulb and second portion of the                            duodenum.                           - No specimens collected.                           Comment: no abnormality to explain his pain. Moderate Sedation:      Moderate (conscious) sedation was administered by the endoscopy nurse       and supervised by the endoscopist. The following parameters were       monitored: oxygen saturation, heart rate, blood pressure, CO2       capnography and response to care. Total physician intraservice time was       10 minutes. Recommendation:           - Patient has a contact number available for                            emergencies. The signs and symptoms of potential                            delayed complications were discussed with the                             patient. Return to normal activities tomorrow.                            Written discharge instructions were provided to the                            patient.                           - Resume previous diet today.                           - Discontinue Rebaprazole but continue present                            medications.                           - Begin Pantoprazole 40 mg po qam.                           - Perform CT scan (computed tomography) of the                            abdomen with contrast at appointment to be                            scheduled. Procedure  Code(s):        --- Professional ---                           [phone], Esophagogastroduodenoscopy, flexible,                            transoral; diagnostic, including collection of                            specimen(s) by brushing or washing, when performed                            (separate procedure)                           G0500, Moderate sedation services provided by the                            same physician or other qualified health care                            professional performing a gastrointestinal                            endoscopic service that sedation supports,                            requiring the presence of an independent trained                            observer to assist in the monitoring of the                            patient's level of consciousness and physiological                            status; initial 15 minutes of intra-service time;                            patient age 50 years or older (additional time may                            be reported with [phone], as appropriate) Diagnosis Code(s):        --- Professional ---                           K22.2, Esophageal obstruction                           K44.9, Diaphragmatic hernia without obstruction or                            gangrene                           R10.13, Epigastric pain  CPT copyright 2019  American Medical Association. All rights reserved. The codes documented in this report are preliminary and upon coder review may  be revised to meet current compliance requirements. Hildred Laser, MD Hildred Laser, MD 11/27/2018 3:52:33 PM This report has been signed electronically. Number of Addenda: 0

## 2018-11-27 NOTE — H&P (Addendum)
[name] is an 68 y.o. male.   Chief Complaint: Patient is here for EGD. HPI: Patient is 68 year old African-American male who has remote history of peptic ulcer disease who has been on a PPI chronically.  He now presents with 31-month history of epigastric pain and bloating which occurs with certain foods particularly fried foods but he gets relief with ice cream.  He says heartburn is well controlled with reprep resolved.  He has not had nausea or vomiting or rectal bleeding.  At times he had streaks of black in his stool but he has not had very recall tarry stool.  He does not take OTC NSAIDs.  Patient quit cigarette smoking in 2008 and has not had any alcohol in 1 year. Recent ultrasound was negative for cholelithiasis.  Past Medical History:  Diagnosis Date  . Cancer [facility])    prostate  . Fracture of finger of right hand    4th and 5 th digit  . Knee fracture     Past Surgical History:  Procedure Laterality Date  . COLONOSCOPY  11/28/2011   Procedure: COLONOSCOPY;  Surgeon: Rogene Houston, MD;  [facility];  Service: Endoscopy;  Laterality: N/A;  930  . COLONOSCOPY N/A 08/07/2017   Procedure: COLONOSCOPY;  Surgeon: Rogene Houston, MD;  Location: [facility];  Service: Endoscopy;  Laterality: N/A;  100  . PERCUTANEOUS PINNING N/A 02/12/2014   Procedure: CLOSED REDUCTION PERCUTANEOUS PINNING 4TH AND 5TH FINGERS;  Surgeon: Marianna Payment, MD;  Location: [facility];  Service: Orthopedics;  Laterality: N/A;  . POLYPECTOMY  08/07/2017   Procedure: POLYPECTOMY;  Surgeon: Rogene Houston, MD;  Location: [facility];  Service: Endoscopy;;  . PROSTATECTOMY  2005   [facility]-Dr. Javaid    Family History  Problem Relation Age of Onset  . Colon cancer Brother   . Asthma Other   . Diabetes Other   . Arthritis Other    Social History:  reports that he has quit smoking. His smoking use included cigarettes. He has a 45.00 pack-year smoking history. He has never used  smokeless tobacco. He reports previous alcohol use of about 20.0 standard drinks of alcohol per week. He reports current drug use. Drug: Marijuana.  Allergies:  Allergies  Allergen Reactions  . Codeine Itching    Medications Prior to Admission  Medication Sig Dispense Refill  . RABEprazole (ACIPHEX) 20 MG tablet Take 1 tablet (20 mg total) by mouth daily. 90 tablet 3  . methocarbamol (ROBAXIN) 500 MG tablet Take 1 tablet (500 mg total) by mouth 3 (three) times daily. (Patient not taking: Reported on 10/30/2018) 21 tablet 0  . tadalafil (CIALIS) 5 MG tablet Take 5-15 mg by mouth daily as needed for erectile dysfunction.     . traMADol (ULTRAM) 50 MG tablet Take 1 tablet (50 mg total) by mouth every 6 (six) hours as needed. (Patient not taking: Reported on 10/30/2018) 10 tablet 0    No results found for this or any previous visit (from the past 48 hour(s)). No results found.  ROS  Blood pressure 129/90, pulse 66, temperature 97.8 F (36.6 C), temperature source Oral, resp. rate 15, SpO2 100 %. Physical Exam  Constitutional: He appears well-developed and well-nourished.  HENT:  Mouth/Throat: Oropharynx is clear and moist.  Eyes: Conjunctivae are normal. No scleral icterus.  Neck: No thyromegaly present.  Cardiovascular: Normal rate, regular rhythm and normal heart sounds.  No murmur heard. Respiratory: Effort normal and breath  sounds normal.  GI: Soft. He exhibits no distension and no mass. There is no abdominal tenderness.  Musculoskeletal:        General: No edema.  Neurological: He is alert.  Skin: Skin is warm and dry.     Assessment/Plan Epigastric pain patient with history of peptic ulcer disease. GERD symptoms well controlled with therapy. Diagnostic  EGD.  Hildred Laser, MD 11/27/2018, 3:22 PM

## 2018-12-01 ENCOUNTER — Other Ambulatory Visit (INDEPENDENT_AMBULATORY_CARE_PROVIDER_SITE_OTHER): Payer: Self-pay | Admitting: *Deleted

## 2018-12-01 ENCOUNTER — Telehealth (INDEPENDENT_AMBULATORY_CARE_PROVIDER_SITE_OTHER): Payer: Self-pay | Admitting: Internal Medicine

## 2018-12-01 DIAGNOSIS — R1013 Epigastric pain: Secondary | ICD-10-CM

## 2018-12-01 DIAGNOSIS — Z1211 Encounter for screening for malignant neoplasm of colon: Secondary | ICD-10-CM

## 2018-12-01 NOTE — Telephone Encounter (Signed)
Patient left message stating he was prescribed a particular medication that was sent into [facility] - patient would like it sent to [facility] in [place]

## 2018-12-02 ENCOUNTER — Encounter (INDEPENDENT_AMBULATORY_CARE_PROVIDER_SITE_OTHER): Payer: Self-pay | Admitting: *Deleted

## 2018-12-02 ENCOUNTER — Other Ambulatory Visit (INDEPENDENT_AMBULATORY_CARE_PROVIDER_SITE_OTHER): Payer: Self-pay | Admitting: *Deleted

## 2018-12-02 DIAGNOSIS — H18413 Arcus senilis, bilateral: Secondary | ICD-10-CM | POA: Diagnosis not present

## 2018-12-02 DIAGNOSIS — H2513 Age-related nuclear cataract, bilateral: Secondary | ICD-10-CM | POA: Diagnosis not present

## 2018-12-02 DIAGNOSIS — H25013 Cortical age-related cataract, bilateral: Secondary | ICD-10-CM | POA: Diagnosis not present

## 2018-12-02 DIAGNOSIS — H25043 Posterior subcapsular polar age-related cataract, bilateral: Secondary | ICD-10-CM | POA: Diagnosis not present

## 2018-12-02 DIAGNOSIS — H2511 Age-related nuclear cataract, right eye: Secondary | ICD-10-CM | POA: Diagnosis not present

## 2018-12-02 MED ORDER — PANTOPRAZOLE SODIUM 40 MG PO TBEC: 40.0000 mg | DELAYED_RELEASE_TABLET | Freq: Every day | ORAL | 5 refills | Status: AC

## 2018-12-02 NOTE — Telephone Encounter (Signed)
A prescription for the Pantoprazole has been sent to the patient's pharmacy.

## 2018-12-02 NOTE — Telephone Encounter (Signed)
Patient stopped by today and just stated if the medication he was given after procedures last week could be sent to [facility] here in [place] cheaper.  Discontinue Rebaprazole but continue present medications. - Begin Pantoprazole 40 mg po qam.  He would greatly appreciate it.  [phone]

## 2018-12-03 ENCOUNTER — Encounter (HOSPITAL_COMMUNITY): Payer: Self-pay | Admitting: Internal Medicine

## 2018-12-11 ENCOUNTER — Ambulatory Visit (HOSPITAL_COMMUNITY)
Admission: RE | Admit: 2018-12-11 | Discharge: 2018-12-11 | Disposition: A | Discharge status: Home / Self Care | Payer: Medicare Other | Source: Ambulatory Visit | Attending: Internal Medicine | Admitting: Internal Medicine

## 2018-12-11 ENCOUNTER — Other Ambulatory Visit: Payer: Self-pay

## 2018-12-11 DIAGNOSIS — R1013 Epigastric pain: Secondary | ICD-10-CM | POA: Insufficient documentation

## 2018-12-11 DIAGNOSIS — I77811 Abdominal aortic ectasia: Secondary | ICD-10-CM | POA: Diagnosis not present

## 2018-12-11 LAB — POCT I-STAT, CHEM 8
BUN: 19 mg/dL (ref 8–23)
Calcium, Ion: 1.22 mmol/L (ref 1.15–1.40)
Chloride: 103 mmol/L (ref 98–111)
Creatinine, Ser: 1.3 mg/dL — ABNORMAL HIGH (ref 0.61–1.24)
Glucose, Bld: 87 mg/dL (ref 70–99)
HCT: 41 % (ref 39.0–52.0)
Hemoglobin: 13.9 g/dL (ref 13.0–17.0)
Potassium: 4.4 mmol/L (ref 3.5–5.1)
Sodium: 140 mmol/L (ref 135–145)
TCO2: 28 mmol/L (ref 22–32)

## 2018-12-11 MED ORDER — IOHEXOL 300 MG/ML  SOLN
100.0000 mL | Freq: Once | INTRAMUSCULAR | Status: AC | PRN
  Administered 2018-12-11: 13:00:00 100 mL via INTRAVENOUS

## 2018-12-29 DIAGNOSIS — L280 Lichen simplex chronicus: Secondary | ICD-10-CM | POA: Diagnosis not present

## 2019-04-21 DIAGNOSIS — H2511 Age-related nuclear cataract, right eye: Secondary | ICD-10-CM | POA: Diagnosis not present

## 2019-04-21 DIAGNOSIS — K219 Gastro-esophageal reflux disease without esophagitis: Secondary | ICD-10-CM | POA: Diagnosis not present

## 2019-04-21 DIAGNOSIS — H2513 Age-related nuclear cataract, bilateral: Secondary | ICD-10-CM | POA: Diagnosis not present

## 2019-04-21 DIAGNOSIS — Z1389 Encounter for screening for other disorder: Secondary | ICD-10-CM | POA: Diagnosis not present

## 2019-04-21 DIAGNOSIS — H25013 Cortical age-related cataract, bilateral: Secondary | ICD-10-CM | POA: Diagnosis not present

## 2019-04-21 DIAGNOSIS — Z0001 Encounter for general adult medical examination with abnormal findings: Secondary | ICD-10-CM | POA: Diagnosis not present

## 2019-04-21 DIAGNOSIS — H18413 Arcus senilis, bilateral: Secondary | ICD-10-CM | POA: Diagnosis not present

## 2019-04-21 DIAGNOSIS — H25043 Posterior subcapsular polar age-related cataract, bilateral: Secondary | ICD-10-CM | POA: Diagnosis not present

## 2019-04-21 DIAGNOSIS — E7849 Other hyperlipidemia: Secondary | ICD-10-CM | POA: Diagnosis not present

## 2019-04-21 DIAGNOSIS — R7309 Other abnormal glucose: Secondary | ICD-10-CM | POA: Diagnosis not present

## 2019-05-06 ENCOUNTER — Encounter (HOSPITAL_COMMUNITY): Payer: Self-pay

## 2019-05-06 ENCOUNTER — Emergency Department (HOSPITAL_COMMUNITY)
Admission: EM | Admit: 2019-05-06 | Discharge: 2019-05-06 | Disposition: A | Discharge status: Home / Self Care | Payer: Medicare Other | Attending: Emergency Medicine | Admitting: Emergency Medicine

## 2019-05-06 ENCOUNTER — Other Ambulatory Visit: Payer: Self-pay

## 2019-05-06 DIAGNOSIS — Z87891 Personal history of nicotine dependence: Secondary | ICD-10-CM | POA: Diagnosis not present

## 2019-05-06 DIAGNOSIS — R0789 Other chest pain: Secondary | ICD-10-CM | POA: Diagnosis not present

## 2019-05-06 DIAGNOSIS — R002 Palpitations: Secondary | ICD-10-CM | POA: Diagnosis not present

## 2019-05-06 DIAGNOSIS — R079 Chest pain, unspecified: Secondary | ICD-10-CM

## 2019-05-06 LAB — CBC
HCT: 45.9 % (ref 39.0–52.0)
Hemoglobin: 15.3 g/dL (ref 13.0–17.0)
MCH: 30.4 pg (ref 26.0–34.0)
MCHC: 33.3 g/dL (ref 30.0–36.0)
MCV: 91.1 fL (ref 80.0–100.0)
Platelets: 230 10*3/uL (ref 150–400)
RBC: 5.04 MIL/uL (ref 4.22–5.81)
RDW: 13.8 % (ref 11.5–15.5)
WBC: 9 10*3/uL (ref 4.0–10.5)
nRBC: 0 % (ref 0.0–0.2)

## 2019-05-06 LAB — TROPONIN I (HIGH SENSITIVITY): Troponin I (High Sensitivity): <2 ng/L (ref <18)

## 2019-05-06 LAB — BASIC METABOLIC PANEL
Anion gap: 11 (ref 5–15)
BUN: 16 mg/dL (ref 8–23)
CO2: 24 mmol/L (ref 22–32)
Calcium: 9.3 mg/dL (ref 8.9–10.3)
Chloride: 104 mmol/L (ref 98–111)
Creatinine, Ser: 1.15 mg/dL (ref 0.61–1.24)
GFR calc Af Amer: >60 mL/min (ref >60)
GFR calc non Af Amer: >60 mL/min (ref >60)
Glucose, Bld: 104 mg/dL — ABNORMAL HIGH (ref 70–99)
Potassium: 3.8 mmol/L (ref 3.5–5.1)
Sodium: 139 mmol/L (ref 135–145)

## 2019-05-06 NOTE — ED Triage Notes (Signed)
Pt started having palpitations yesterday, but started having chest pain today. Right sided pain that is now radiating to right arm. Denies nausea or vomiting.

## 2019-05-06 NOTE — Discharge Instructions (Addendum)
The testing today did not show any heart problems.  The palpitations are likely related to the exertion.  If you feel your heart is skipping or racing, feel dizzy or weak, sit down immediately.  If the symptoms resolve quickly and do not recur that is reassuring.  If you have persistent feeling of palpitations and/or dizziness, you should see the cardiologist we referred you to.  Return here if needed for problems.

## 2019-05-06 NOTE — ED Provider Notes (Signed)
[facility] EMERGENCY DEPARTMENT Provider Note   CSN: [id number] Arrival date & time: 05/06/19  1053     History Chief Complaint  Patient presents with  . Chest Pain    [name] is a 69 y.o. male.  HPI He presents for evaluation of palpitations, which occurred yesterday and have not recurred.  This happened while he was moving a dog house that weighed about 100 pounds.  Shortly after that he had a very brief period of right-sided anterior chest pain radiating to his right arm.  He again had that similar chest discomfort earlier today that was also transient and spontaneously resolved.  There is no near-syncope or syncope with either event.  No prior cardiac disease.  Denies recent fever, chills, cough, shortness of breath, nausea or vomiting.  There are no other known modifying factors.    Past Medical History:  Diagnosis Date  . Cancer [facility])    prostate  . Fracture of finger of right hand    4th and 5 th digit  . Knee fracture     Patient Active Problem List   Diagnosis Date Noted  . Abdominal pain, epigastric 10/30/2018  . Gastroesophageal reflux disease without esophagitis 10/30/2018  . History of colonic polyps 05/08/2017  . Family history of colon cancer 05/08/2017  . Weakness of right leg 06/16/2012  . Balance problem 06/16/2012  . Difficulty walking 06/16/2012  . Knee contusion 05/12/2012  . Tibial plateau fracture 01/24/2012    Past Surgical History:  Procedure Laterality Date  . COLONOSCOPY  11/28/2011   Procedure: COLONOSCOPY;  Surgeon: Rogene Houston, MD;  [facility];  Service: Endoscopy;  Laterality: N/A;  930  . COLONOSCOPY N/A 08/07/2017   Procedure: COLONOSCOPY;  Surgeon: Rogene Houston, MD;  [facility];  Service: Endoscopy;  Laterality: N/A;  100  . ESOPHAGOGASTRODUODENOSCOPY N/A 11/27/2018   Procedure: ESOPHAGOGASTRODUODENOSCOPY (EGD);  Surgeon: Rogene Houston, MD;  [facility];  Service: Endoscopy;   Laterality: N/A;  . PERCUTANEOUS PINNING N/A 02/12/2014   Procedure: CLOSED REDUCTION PERCUTANEOUS PINNING 4TH AND 5TH FINGERS;  Surgeon: Marianna Payment, MD;  Location: [place];  Service: Orthopedics;  Laterality: N/A;  . POLYPECTOMY  08/07/2017   Procedure: POLYPECTOMY;  Surgeon: Rogene Houston, MD;  [facility];  Service: Endoscopy;;  . PROSTATECTOMY  2005   [facility]-Dr. Javaid       Family History  Problem Relation Age of Onset  . Colon cancer Brother   . Asthma Other   . Diabetes Other   . Arthritis Other     Social History   Tobacco Use  . Smoking status: Former Smoker    Packs/day: 1.00    Years: 45.00    Pack years: 45.00    Types: Cigarettes  . Smokeless tobacco: Never Used  Substance Use Topics  . Alcohol use: Not Currently    Alcohol/week: 7.0 standard drinks    Types: 7 Cans of beer per week    Comment: 1 beer a day   . Drug use: Yes    Types: Marijuana    Comment: Daily     Home Medications Prior to Admission medications   Medication Sig Start Date End Date Taking? Authorizing Provider  DUREZOL 0.05 % EMUL Place 1 drop into the right eye 3 (three) times daily. 04/21/19  Yes [provider]  PROLENSA 0.07 % SOLN Place 1 drop into the right eye as directed.  05/01/19  Yes [provider]  pantoprazole (PROTONIX) 40 MG tablet Take 1 tablet (40 mg total) by mouth daily before breakfast. Patient not taking: Reported on 05/06/2019 12/02/18   Rogene Houston, MD  tadalafil (CIALIS) 5 MG tablet Take 5-15 mg by mouth daily as needed for erectile dysfunction.  08/01/18   [provider]    Allergies    Codeine  Review of Systems   Review of Systems  All other systems reviewed and are negative.   Physical Exam Updated Vital Signs BP 136/85   Pulse 71   Temp 98 F (36.7 C) (Oral)   Resp 12   Ht 6\' 2"  (1.88 m)   Wt 82.6 kg   SpO2 97%   BMI 23.37 kg/m   Physical Exam Vitals and nursing note reviewed.    Constitutional:      General: He is not in acute distress.    Appearance: He is well-developed. He is not ill-appearing, toxic-appearing or diaphoretic.  HENT:     Head: Normocephalic and atraumatic.     Right Ear: External ear normal.     Left Ear: External ear normal.  Eyes:     Conjunctiva/sclera: Conjunctivae normal.     Pupils: Pupils are equal, round, and reactive to light.  Neck:     Trachea: Phonation normal.  Cardiovascular:     Rate and Rhythm: Normal rate and regular rhythm.     Heart sounds: Normal heart sounds.  Pulmonary:     Effort: Pulmonary effort is normal.     Breath sounds: Normal breath sounds.  Abdominal:     General: There is no distension.  Musculoskeletal:        General: Normal range of motion.     Cervical back: Normal range of motion and neck supple.  Skin:    General: Skin is warm and dry.  Neurological:     Mental Status: He is alert and oriented to person, place, and time.     Cranial Nerves: No cranial nerve deficit.     Sensory: No sensory deficit.     Motor: No abnormal muscle tone.     Coordination: Coordination normal.  Psychiatric:        Mood and Affect: Mood normal.        Behavior: Behavior normal.        Thought Content: Thought content normal.        Judgment: Judgment normal.     ED Results / Procedures / Treatments   Labs (all labs ordered are listed, but only abnormal results are displayed) Labs Reviewed  BASIC METABOLIC PANEL - Abnormal; Notable for the following components:      Result Value   Glucose, Bld 104 (*)    All other components within normal limits  CBC  TROPONIN I (HIGH SENSITIVITY)    EKG EKG Interpretation  Date/Time:  Wednesday May 06 2019 11:01:42 EST Ventricular Rate:  67 PR Interval:    QRS Duration: 102 QT Interval:  391 QTC Calculation: 413 R Axis:   53 Text Interpretation: Sinus rhythm No old tracing to compare Confirmed by Daleen Bo [phone]) on 05/06/2019 1:06:30  PM   Radiology No results found.  Procedures Procedures (including critical care time)  Medications Ordered in ED Medications - No data to display  ED Course  I have reviewed the triage vital signs and the nursing notes.  Pertinent labs & imaging results that were available during my care of the patient were reviewed by me and  considered in my medical decision making (see chart for details).  Clinical Course as of May 05 1313  Wed May 06, 2019  1305 Normal  Troponin I (High Sensitivity) [EW]  1306 Normal  CBC [EW]  [id number] Essentially normal  Basic metabolic panel(!) [EW]    Clinical Course User Index [EW] Daleen Bo, MD   MDM Rules/Calculators/A&P                       Patient Vitals for the past 24 hrs:  BP Temp Temp src Pulse Resp SpO2 Height Weight  05/06/19 1300 136/85 -- -- 71 12 97 % -- --  05/06/19 1230 (!) 130/92 -- -- 64 (!) 9 99 % -- --  05/06/19 1130 (!) 137/93 -- -- 65 11 98 % -- --  05/06/19 1105 (!) 148/108 98 F (36.7 C) Oral 76 14 100 % -- --  05/06/19 1103 -- -- -- -- -- -- 6\' 2"  (1.88 m) 82.6 kg    1:12 PM Reevaluation with update and discussion. After initial assessment and treatment, an updated evaluation reveals he is comfortable and has not had recurrence of other chest pain or palpitations.  Findings discussed with the patient and all questions were answered. Daleen Bo   Medical Decision Making: Nonspecific palpitations and chest wall discomfort, occurring when moving a heavy object, yesterday.  ED evaluation for acute cardiac abnormalities and pulmonary abnormalities is negative.  Doubt ACS, PE or pneumonia.  Stable for discharge with outpatient management if he has recurrence of symptoms.  CRITICAL CARE-no Performed by: Daleen Bo   Nursing Notes Reviewed/ Care Coordinated Applicable Imaging Reviewed Interpretation of Laboratory Data incorporated into ED treatment  The patient appears reasonably screened and/or stabilized for  discharge and I doubt any other medical condition or other [facility] requiring further screening, evaluation, or treatment in the ED at this time prior to discharge.  Plan: Home Medications-routine medications including OTC as needed; Home Treatments-rest, heat if needed; return here if the recommended treatment, does not improve the symptoms; Recommended follow up-cardiology follow-up if having persistent palpitations or signs of presyncope or syncope.    Final Clinical Impression(s) / ED Diagnoses Final diagnoses:  Palpitation  Nonspecific chest pain    Rx / DC Orders ED Discharge Orders    None       Daleen Bo, MD 05/06/19 1315

## 2019-05-11 DIAGNOSIS — H25811 Combined forms of age-related cataract, right eye: Secondary | ICD-10-CM | POA: Diagnosis not present

## 2019-05-11 DIAGNOSIS — H2511 Age-related nuclear cataract, right eye: Secondary | ICD-10-CM | POA: Diagnosis not present

## 2019-05-12 DIAGNOSIS — H2512 Age-related nuclear cataract, left eye: Secondary | ICD-10-CM | POA: Diagnosis not present

## 2019-05-12 DIAGNOSIS — H25012 Cortical age-related cataract, left eye: Secondary | ICD-10-CM | POA: Diagnosis not present

## 2019-05-12 DIAGNOSIS — H25042 Posterior subcapsular polar age-related cataract, left eye: Secondary | ICD-10-CM | POA: Diagnosis not present

## 2019-05-20 ENCOUNTER — Other Ambulatory Visit: Payer: Self-pay | Admitting: Family Medicine

## 2019-05-20 DIAGNOSIS — M940 Chondrocostal junction syndrome [Tietze]: Secondary | ICD-10-CM

## 2019-06-03 DIAGNOSIS — H2512 Age-related nuclear cataract, left eye: Secondary | ICD-10-CM | POA: Diagnosis not present

## 2019-06-05 ENCOUNTER — Ambulatory Visit (HOSPITAL_COMMUNITY)
Admission: RE | Admit: 2019-06-05 | Discharge: 2019-06-05 | Disposition: A | Discharge status: Home / Self Care | Payer: Medicare Other | Source: Ambulatory Visit | Attending: Family Medicine | Admitting: Family Medicine

## 2019-06-05 ENCOUNTER — Other Ambulatory Visit: Payer: Self-pay

## 2019-06-05 DIAGNOSIS — Z87891 Personal history of nicotine dependence: Secondary | ICD-10-CM | POA: Insufficient documentation

## 2019-06-05 DIAGNOSIS — J432 Centrilobular emphysema: Secondary | ICD-10-CM | POA: Diagnosis not present

## 2019-06-05 DIAGNOSIS — Z122 Encounter for screening for malignant neoplasm of respiratory organs: Secondary | ICD-10-CM | POA: Diagnosis not present

## 2019-06-05 DIAGNOSIS — I700 Atherosclerosis of aorta: Secondary | ICD-10-CM | POA: Insufficient documentation

## 2019-06-05 DIAGNOSIS — I2510 Atherosclerotic heart disease of native coronary artery without angina pectoris: Secondary | ICD-10-CM | POA: Diagnosis not present

## 2019-06-05 DIAGNOSIS — M940 Chondrocostal junction syndrome [Tietze]: Secondary | ICD-10-CM

## 2019-07-10 IMAGING — CT CT CHEST LUNG CANCER SCREENING LOW DOSE W/O CM
2 of 4 series · 15 of 40 positions shown, 18 images · non-contrast
Comparison: None.

CLINICAL DATA: 67-year-old male former smoker, quit 11 years ago,
with 42 pack-year history of smoking, for initial lung cancer
screening

EXAM:
CT CHEST WITHOUT CONTRAST LOW-DOSE FOR LUNG CANCER SCREENING
TECHNIQUE: Multidetector CT imaging of the chest was performed following the
standard protocol without IV contrast.

[Series 2: axial st · axial · 0.69mm/px · z∈[+1262,+1552]mm · 12 of 70 slices shown, 15 images]
[im 6/70  mediastinal]
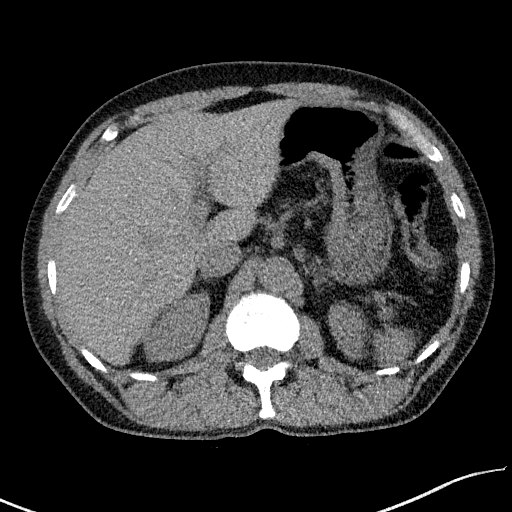
[im 6/70  lung]
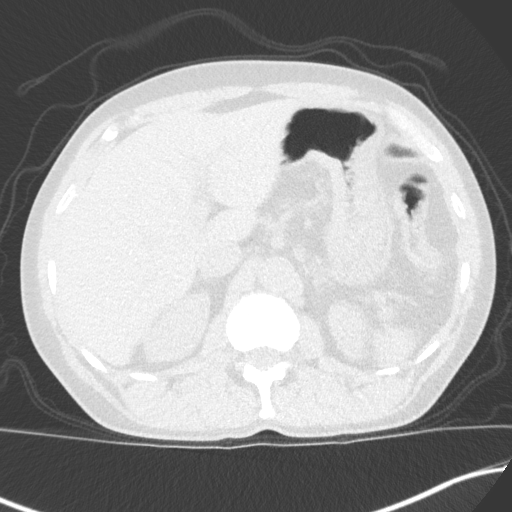
[im 11/70  lung]
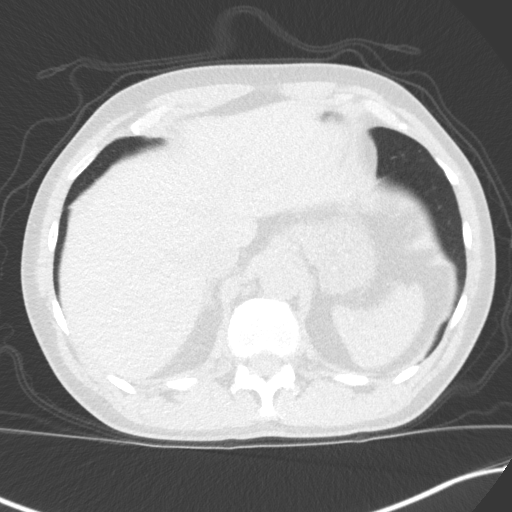
[im 16/70  lung]
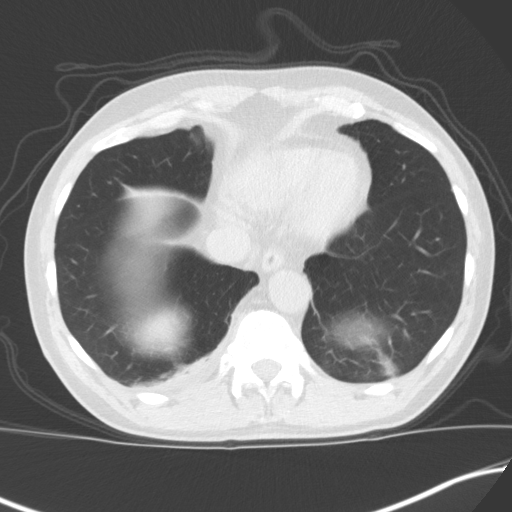
[im 22/70  lung]
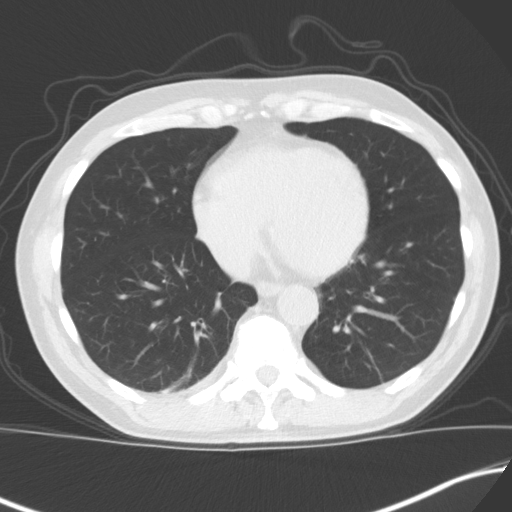
[im 27/70  mediastinal]
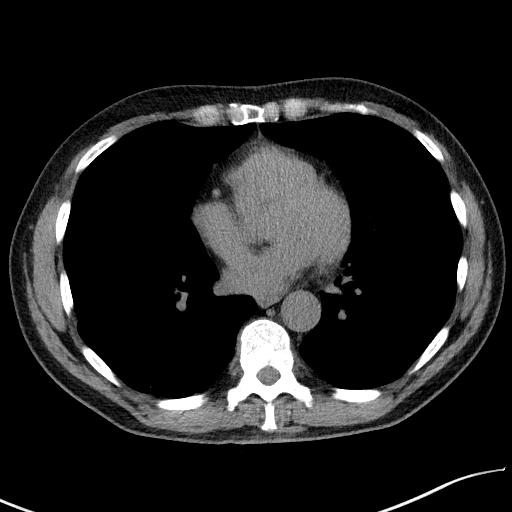
[im 27/70  lung]
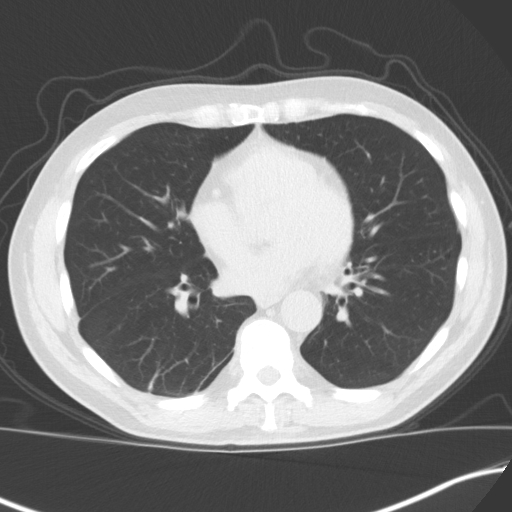
[im 32/70  lung]
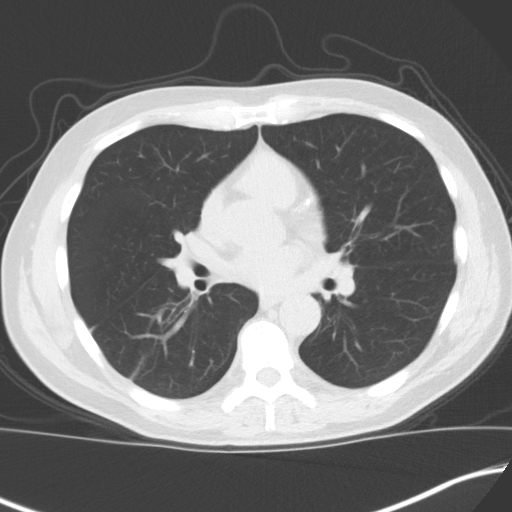
[im 38/70  lung]
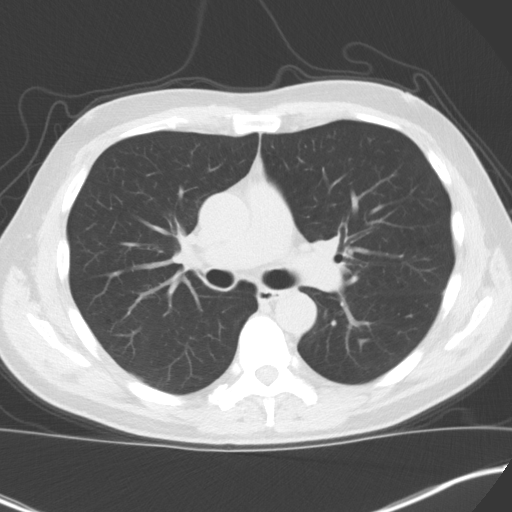
[im 43/70  lung]
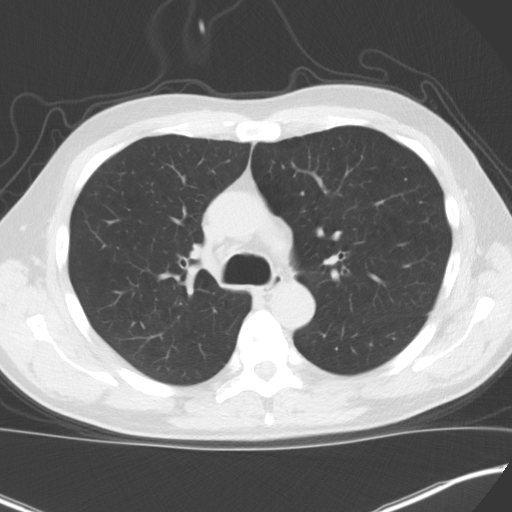
[im 48/70  mediastinal]
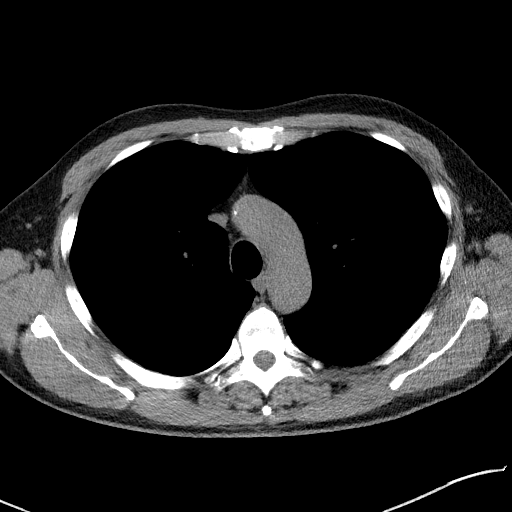
[im 48/70  lung]
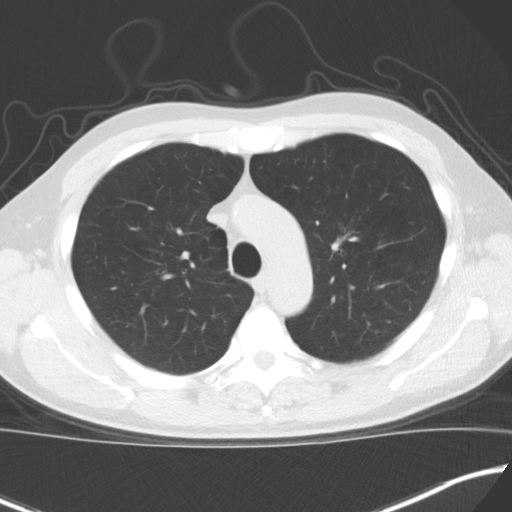
[im 54/70  lung]
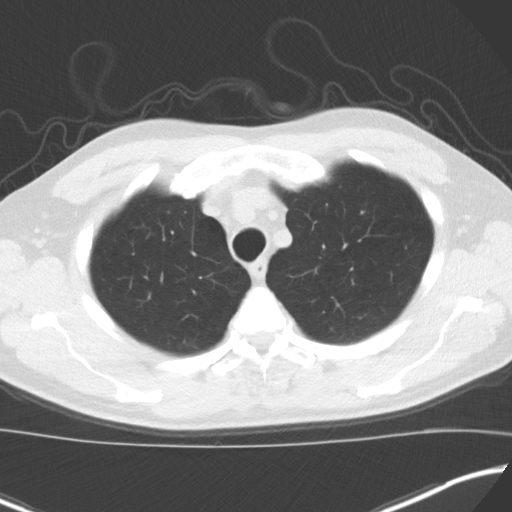
[im 59/70  lung]
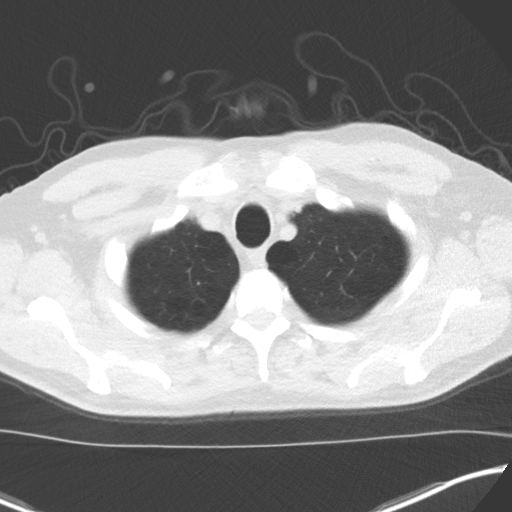
[im 64/70  lung]
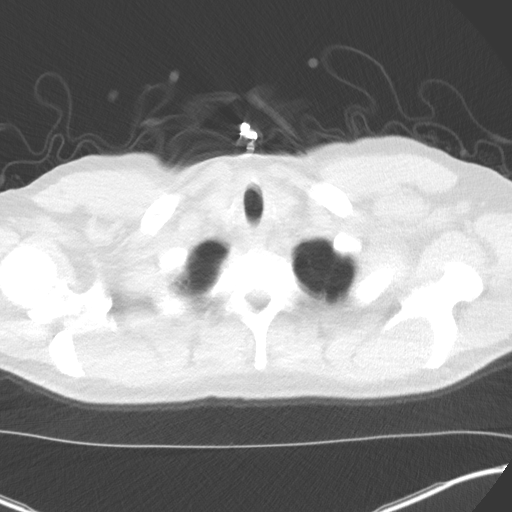

[Series 5: coronal · coronal · 0.71mm/px · 3 of 258 slices shown]
[im 52/258  lung]
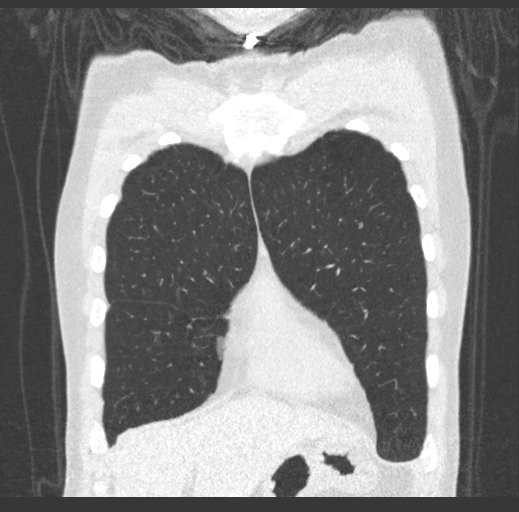
[im 103/258  lung]
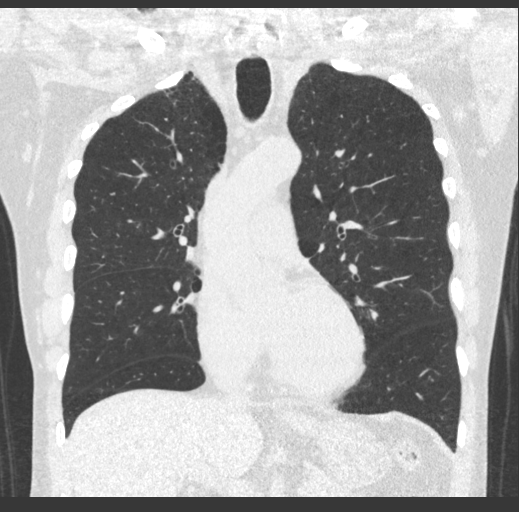
[im 155/258  lung]
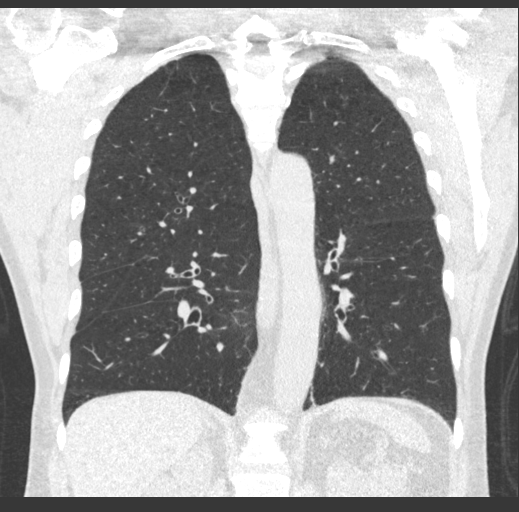

[15 of 40 positions shown; findings below may reference images not displayed]

FINDINGS: Cardiovascular: Heart is normal in size.  No pericardial effusion.

No evidence thoracic aortic aneurysm.

Coronary atherosclerosis of the LAD and right coronary artery.

Mediastinum/Nodes: No suspicious mediastinal lymphadenopathy.

Visualized thyroid is unremarkable.

Lungs/Pleura: Moderate centrilobular and paraseptal emphysematous
changes, upper lobe predominant.

No focal consolidation.

Mild scarring in the bilateral lower lobes.

No suspicious pulmonary nodules.

No pleural effusion or pneumothorax.

Upper Abdomen: Visualized upper abdomen is grossly unremarkable.

Musculoskeletal: Visualized osseous structures are within normal
limits.
IMPRESSION: Lung-RADS 1, negative. Continue annual screening with low-dose chest
CT without contrast in 12 months.

Emphysema ([id number]).

## 2019-12-23 DIAGNOSIS — H3540 Unspecified peripheral retinal degeneration: Secondary | ICD-10-CM | POA: Diagnosis not present

## 2019-12-23 DIAGNOSIS — Z961 Presence of intraocular lens: Secondary | ICD-10-CM | POA: Diagnosis not present

## 2020-01-07 DIAGNOSIS — R0689 Other abnormalities of breathing: Secondary | ICD-10-CM | POA: Diagnosis not present

## 2020-01-07 DIAGNOSIS — R110 Nausea: Secondary | ICD-10-CM | POA: Diagnosis not present

## 2020-01-07 DIAGNOSIS — R58 Hemorrhage, not elsewhere classified: Secondary | ICD-10-CM | POA: Diagnosis not present

## 2020-01-07 DIAGNOSIS — R1111 Vomiting without nausea: Secondary | ICD-10-CM | POA: Diagnosis not present

## 2020-04-11 DIAGNOSIS — Z1159 Encounter for screening for other viral diseases: Secondary | ICD-10-CM | POA: Diagnosis not present

## 2020-04-18 DIAGNOSIS — Z1159 Encounter for screening for other viral diseases: Secondary | ICD-10-CM | POA: Diagnosis not present

## 2020-04-21 DIAGNOSIS — K5730 Diverticulosis of large intestine without perforation or abscess without bleeding: Secondary | ICD-10-CM | POA: Diagnosis not present

## 2020-04-21 DIAGNOSIS — Z6824 Body mass index (BMI) 24.0-24.9, adult: Secondary | ICD-10-CM | POA: Diagnosis not present

## 2020-04-21 DIAGNOSIS — Z0001 Encounter for general adult medical examination with abnormal findings: Secondary | ICD-10-CM | POA: Diagnosis not present

## 2020-04-21 DIAGNOSIS — K279 Peptic ulcer, site unspecified, unspecified as acute or chronic, without hemorrhage or perforation: Secondary | ICD-10-CM | POA: Diagnosis not present

## 2020-04-21 DIAGNOSIS — E7849 Other hyperlipidemia: Secondary | ICD-10-CM | POA: Diagnosis not present

## 2020-04-21 DIAGNOSIS — E782 Mixed hyperlipidemia: Secondary | ICD-10-CM | POA: Diagnosis not present

## 2020-04-21 DIAGNOSIS — Z1389 Encounter for screening for other disorder: Secondary | ICD-10-CM | POA: Diagnosis not present

## 2020-04-21 DIAGNOSIS — Z86010 Personal history of colonic polyps: Secondary | ICD-10-CM | POA: Diagnosis not present

## 2020-05-16 ENCOUNTER — Other Ambulatory Visit (HOSPITAL_COMMUNITY): Payer: Self-pay | Admitting: Family Medicine

## 2020-05-16 ENCOUNTER — Other Ambulatory Visit: Payer: Self-pay | Admitting: Family Medicine

## 2020-05-16 DIAGNOSIS — Z122 Encounter for screening for malignant neoplasm of respiratory organs: Secondary | ICD-10-CM

## 2020-05-16 DIAGNOSIS — Z87891 Personal history of nicotine dependence: Secondary | ICD-10-CM

## 2020-06-09 ENCOUNTER — Encounter (HOSPITAL_COMMUNITY): Payer: Self-pay

## 2020-06-09 ENCOUNTER — Ambulatory Visit (HOSPITAL_COMMUNITY): Admission: RE | Admit: 2020-06-09 | Payer: Medicare Other | Source: Ambulatory Visit

## 2020-07-12 DIAGNOSIS — Z87891 Personal history of nicotine dependence: Secondary | ICD-10-CM | POA: Diagnosis not present

## 2020-07-12 DIAGNOSIS — L309 Dermatitis, unspecified: Secondary | ICD-10-CM | POA: Diagnosis not present

## 2021-04-24 DIAGNOSIS — Z6825 Body mass index (BMI) 25.0-25.9, adult: Secondary | ICD-10-CM | POA: Diagnosis not present

## 2021-04-24 DIAGNOSIS — Z86010 Personal history of colonic polyps: Secondary | ICD-10-CM | POA: Diagnosis not present

## 2021-04-24 DIAGNOSIS — Z1331 Encounter for screening for depression: Secondary | ICD-10-CM | POA: Diagnosis not present

## 2021-04-24 DIAGNOSIS — E7849 Other hyperlipidemia: Secondary | ICD-10-CM | POA: Diagnosis not present

## 2021-04-24 DIAGNOSIS — K279 Peptic ulcer, site unspecified, unspecified as acute or chronic, without hemorrhage or perforation: Secondary | ICD-10-CM | POA: Diagnosis not present

## 2021-04-24 DIAGNOSIS — C61 Malignant neoplasm of prostate: Secondary | ICD-10-CM | POA: Diagnosis not present

## 2021-04-24 DIAGNOSIS — E663 Overweight: Secondary | ICD-10-CM | POA: Diagnosis not present

## 2021-04-24 DIAGNOSIS — K5730 Diverticulosis of large intestine without perforation or abscess without bleeding: Secondary | ICD-10-CM | POA: Diagnosis not present

## 2021-04-24 DIAGNOSIS — Z0000 Encounter for general adult medical examination without abnormal findings: Secondary | ICD-10-CM | POA: Diagnosis not present

## 2021-09-21 ENCOUNTER — Other Ambulatory Visit (HOSPITAL_COMMUNITY): Payer: Self-pay | Admitting: Family Medicine

## 2021-09-21 ENCOUNTER — Other Ambulatory Visit: Payer: Self-pay | Admitting: Family Medicine

## 2021-09-21 DIAGNOSIS — Z87891 Personal history of nicotine dependence: Secondary | ICD-10-CM

## 2021-10-30 ENCOUNTER — Ambulatory Visit (HOSPITAL_COMMUNITY)
Admission: RE | Admit: 2021-10-30 | Discharge: 2021-10-30 | Disposition: A | Discharge status: Home / Self Care | Payer: No Typology Code available for payment source | Source: Ambulatory Visit | Attending: Family Medicine | Admitting: Family Medicine

## 2021-10-30 DIAGNOSIS — Z87891 Personal history of nicotine dependence: Secondary | ICD-10-CM | POA: Insufficient documentation

## 2021-11-12 ENCOUNTER — Encounter (HOSPITAL_COMMUNITY): Payer: Self-pay | Admitting: Emergency Medicine

## 2021-11-12 ENCOUNTER — Emergency Department (HOSPITAL_COMMUNITY): Payer: No Typology Code available for payment source

## 2021-11-12 ENCOUNTER — Other Ambulatory Visit: Payer: Self-pay

## 2021-11-12 ENCOUNTER — Emergency Department (HOSPITAL_COMMUNITY)
Admission: EM | Admit: 2021-11-12 | Discharge: 2021-11-12 | Disposition: A | Discharge status: Home / Self Care | Payer: No Typology Code available for payment source | Attending: Emergency Medicine | Admitting: Emergency Medicine

## 2021-11-12 DIAGNOSIS — R519 Headache, unspecified: Secondary | ICD-10-CM | POA: Diagnosis not present

## 2021-11-12 DIAGNOSIS — Z8546 Personal history of malignant neoplasm of prostate: Secondary | ICD-10-CM | POA: Insufficient documentation

## 2021-11-12 DIAGNOSIS — Z87891 Personal history of nicotine dependence: Secondary | ICD-10-CM | POA: Diagnosis not present

## 2021-11-12 DIAGNOSIS — R42 Dizziness and giddiness: Secondary | ICD-10-CM | POA: Insufficient documentation

## 2021-11-12 LAB — CBC
HCT: 41.6 % (ref 39.0–52.0)
Hemoglobin: 13.9 g/dL (ref 13.0–17.0)
MCH: 30.6 pg (ref 26.0–34.0)
MCHC: 33.4 g/dL (ref 30.0–36.0)
MCV: 91.6 fL (ref 80.0–100.0)
Platelets: 224 10*3/uL (ref 150–400)
RBC: 4.54 MIL/uL (ref 4.22–5.81)
RDW: 13.7 % (ref 11.5–15.5)
WBC: 6.8 10*3/uL (ref 4.0–10.5)
nRBC: 0 % (ref 0.0–0.2)

## 2021-11-12 LAB — COMPREHENSIVE METABOLIC PANEL
ALT: 18 U/L (ref 0–44)
AST: 26 U/L (ref 15–41)
Albumin: 3.8 g/dL (ref 3.5–5.0)
Alkaline Phosphatase: 53 U/L (ref 38–126)
Anion gap: 5 (ref 5–15)
BUN: 21 mg/dL (ref 8–23)
CO2: 24 mmol/L (ref 22–32)
Calcium: 8.8 mg/dL — ABNORMAL LOW (ref 8.9–10.3)
Chloride: 110 mmol/L (ref 98–111)
Creatinine, Ser: 1.09 mg/dL (ref 0.61–1.24)
GFR, Estimated: >60 mL/min (ref >60)
Glucose, Bld: 152 mg/dL — ABNORMAL HIGH (ref 70–99)
Potassium: 3.5 mmol/L (ref 3.5–5.1)
Sodium: 139 mmol/L (ref 135–145)
Total Bilirubin: 0.5 mg/dL (ref 0.3–1.2)
Total Protein: 7.7 g/dL (ref 6.5–8.1)

## 2021-11-12 LAB — PROTIME-INR
INR: 0.9 (ref 0.8–1.2)
Prothrombin Time: 12.4 seconds (ref 11.4–15.2)

## 2021-11-12 MED ORDER — SODIUM CHLORIDE 0.9 % IV BOLUS
1000.0000 mL | Freq: Once | INTRAVENOUS | Status: AC
  Administered 2021-11-12: 1000 mL via INTRAVENOUS

## 2021-11-12 NOTE — ED Notes (Signed)
Pt returned from CT Scan 

## 2021-11-12 NOTE — Discharge Instructions (Signed)
You were evaluated in the Emergency Department and after careful evaluation, we did not find any emergent condition requiring admission or further testing in the hospital.  Your exam/testing today is overall reassuring.  Symptoms seem to be due to dehydration or mild heat exhaustion.  Recommend resting and drinking plenty of fluids over the next few days.  If symptoms not going away recommend follow-up with primary care doctor or return to the emergency department.  Please return to the Emergency Department if you experience any worsening of your condition.   Thank you for allowing [place] to be a part of your care.

## 2021-11-12 NOTE — ED Triage Notes (Signed)
Increased dizziness over last couple of days and pain to back of head.

## 2021-11-12 NOTE — ED Notes (Signed)
Pt reports trying '600mg'$  Ibuprofen yesterday w/o relief of HA symptoms. Denies blurry vision, but does endorse distal left arm "tingles and numbness". Pt denies injury, denies back, jaw, neck, chest pain. Pt denies N/V/D. Pt does admit he may be dehydrated. Pt reports not liking to "take medications" so he came to ED for evaluation.

## 2021-11-12 NOTE — ED Notes (Signed)
ED Provider at bedside. 

## 2021-11-12 NOTE — ED Provider Notes (Signed)
[facility] Emergency Department Provider Note MRN:  [id number]  Arrival date & time: 11/12/21     Chief Complaint   Dizziness   History of Present Illness   [name] is a 71 y.o. year-old male with a history of prostate cancer presenting to the ED with chief complaint of dizziness.  Dizziness described as being on a boat on and off for the past few days but becoming more frequent.  Also with new posterior headaches for the past few days.  Also with paresthesia to the fingers of the left hand.  Denies numbness or weakness to the arms or legs, no chest pain or shortness of breath, no abdominal pain.  Has been outside doing yard work recently.  Review of Systems  A thorough review of systems was obtained and all systems are negative except as noted in the HPI and PMH.   Patient's Health History    Past Medical History:  Diagnosis Date   Cancer [facility])    prostate   Fracture of finger of right hand    4th and 5 th digit   Knee fracture     Past Surgical History:  Procedure Laterality Date   COLONOSCOPY  11/28/2011   Procedure: COLONOSCOPY;  Surgeon: Rogene Houston, MD;  [facility];  Service: Endoscopy;  Laterality: N/A;  930   COLONOSCOPY N/A 08/07/2017   Procedure: COLONOSCOPY;  Surgeon: Rogene Houston, MD;  [facility];  Service: Endoscopy;  Laterality: N/A;  100   ESOPHAGOGASTRODUODENOSCOPY N/A 11/27/2018   Procedure: ESOPHAGOGASTRODUODENOSCOPY (EGD);  Surgeon: Rogene Houston, MD;  [facility];  Service: Endoscopy;  Laterality: N/A;   PERCUTANEOUS PINNING N/A 02/12/2014   Procedure: CLOSED REDUCTION PERCUTANEOUS PINNING 4TH AND 5TH FINGERS;  Surgeon: Marianna Payment, MD;  Location: [place];  Service: Orthopedics;  Laterality: N/A;   POLYPECTOMY  08/07/2017   Procedure: POLYPECTOMY;  Surgeon: Rogene Houston, MD;  [facility];  Service: Endoscopy;;   PROSTATECTOMY  2005   [facility]-Dr. Michela Pitcher     Family History  Problem Relation Age of Onset   Colon cancer Brother    Asthma Other    Diabetes Other    Arthritis Other     Social History   Socioeconomic History   Marital status: Married    Spouse name: Not on file   Number of children: Not on file   Years of education: 11   Highest education level: Not on file  Occupational History    Employer: [employer]  Tobacco Use   Smoking status: Former    Packs/day: 1.00    Years: 45.00    Total pack years: 45.00    Types: Cigarettes   Smokeless tobacco: Never  Vaping Use   Vaping Use: Never used  Substance and Sexual Activity   Alcohol use: Not Currently    Alcohol/week: 7.0 standard drinks of alcohol    Types: 7 Cans of beer per week    Comment: 1 beer a day    Drug use: Yes    Types: Marijuana    Comment: Daily    Sexual activity: Not on file  Other Topics Concern   Not on file  Social History Narrative   Not on file   Social Determinants of Health   Financial Resource Strain: Not on file  Food Insecurity: Not on file  Transportation Needs: Not on file  Physical Activity: Not on file  Stress:  Not on file  Social Connections: Not on file  Intimate Partner Violence: Not on file     Physical Exam   Vitals:   11/12/21 0400 11/12/21 0500  BP: (!) 147/94 (!) 150/91  Pulse: 64 (!) 56  Resp: 15 14  Temp:    SpO2: 100% 100%    CONSTITUTIONAL: Well-appearing, NAD NEURO/PSYCH:  Alert and oriented x 3, normal and symmetric strength and sensation, normal coordination, normal speech EYES:  eyes equal and reactive ENT/NECK:  no LAD, no JVD CARDIO: Regular rate, well-perfused, normal S1 and S2 PULM:  CTAB no wheezing or rhonchi GI/GU:  non-distended, non-tender MSK/SPINE:  No gross deformities, no edema SKIN:  no rash, atraumatic   *Additional and/or pertinent findings included in MDM below  Diagnostic and Interventional Summary    EKG Interpretation  Date/Time:  Sunday November 12 2021 03:29:08  EDT Ventricular Rate:  65 PR Interval:  170 QRS Duration: 106 QT Interval:  438 QTC Calculation: 456 R Axis:   51 Text Interpretation: Sinus rhythm Confirmed by Gerlene Fee [phone]) on 11/12/2021 3:55:06 AM       Labs Reviewed  COMPREHENSIVE METABOLIC PANEL - Abnormal; Notable for the following components:      Result Value   Glucose, Bld 152 (*)    Calcium 8.8 (*)    All other components within normal limits  CBC  PROTIME-INR    CT HEAD WO CONTRAST (5MM)  Final Result      Medications  sodium chloride 0.9 % bolus 1,000 mL (0 mLs Intravenous Stopped 11/12/21 0508)     Procedures  /  Critical Care Procedures  ED Course and Medical Decision Making  Initial Impression and Ddx Differential diagnosis includes dehydration, anemia, electrolyte disturbance, central vertigo due to stroke.  Awaiting labs, CT.  Will consider MRI.  Past medical/surgical history that increases complexity of ED encounter: None  Interpretation of Diagnostics I personally reviewed the EKG and my interpretation is as follows: Sinus rhythm  Labs reassuring with no significant blood count or electrolyte disturbance, CT head is normal, nothing acute and also no signs of chronic ischemia  Patient Reassessment and Ultimate Disposition/Management     Patient feeling well after fluids, he explains that the dizziness has been worse when standing from a seated position over the past few days.  He has been outside all day for the past 2 days working, works as a Public house manager of homes.  Favoring dehydration, heat exhaustion, the description of favors more of a lightheadedness and orthostasis rather than vertigo.  MRI not indicated, appropriate for discharge.  Patient management required discussion with the following services or consulting groups:  None  Complexity of Problems Addressed Acute illness or injury that poses threat of life of bodily function  Additional Data Reviewed and Analyzed Further history  obtained from: None  Additional Factors Impacting ED Encounter Risk None  Barth Kirks. [place] Small, [place] [email or web address]  Final Clinical Impressions(s) / ED Diagnoses     ICD-10-CM   1. Dizziness  R42       ED Discharge Orders     None        Discharge Instructions Discussed with and Provided to Patient:     Discharge Instructions      You were evaluated in the Emergency Department and after careful evaluation, we did not find any emergent condition requiring admission or further testing in the hospital.  Your exam/testing today is overall reassuring.  Symptoms  seem to be due to dehydration or mild heat exhaustion.  Recommend resting and drinking plenty of fluids over the next few days.  If symptoms not going away recommend follow-up with primary care doctor or return to the emergency department.  Please return to the Emergency Department if you experience any worsening of your condition.   Thank you for allowing [place] to be a part of your care.       Maudie Flakes, MD 11/12/21 [phone]

## 2021-11-12 NOTE — ED Notes (Signed)
Patient transported to CT 

## 2021-12-16 NOTE — Progress Notes (Signed)
Ernesteen Mihalic M. Dawson Albers, MD  Emergency Medicine [facility] [email or web address]  

## 2022-01-15 DIAGNOSIS — L219 Seborrheic dermatitis, unspecified: Secondary | ICD-10-CM | POA: Diagnosis not present

## 2022-01-15 DIAGNOSIS — S335XXA Sprain of ligaments of lumbar spine, initial encounter: Secondary | ICD-10-CM | POA: Diagnosis not present

## 2022-01-15 DIAGNOSIS — Z6825 Body mass index (BMI) 25.0-25.9, adult: Secondary | ICD-10-CM | POA: Diagnosis not present

## 2022-05-01 DIAGNOSIS — Z86010 Personal history of colonic polyps: Secondary | ICD-10-CM | POA: Diagnosis not present

## 2022-05-01 DIAGNOSIS — E7849 Other hyperlipidemia: Secondary | ICD-10-CM | POA: Diagnosis not present

## 2022-05-01 DIAGNOSIS — Z6826 Body mass index (BMI) 26.0-26.9, adult: Secondary | ICD-10-CM | POA: Diagnosis not present

## 2022-05-01 DIAGNOSIS — Z0001 Encounter for general adult medical examination with abnormal findings: Secondary | ICD-10-CM | POA: Diagnosis not present

## 2022-05-01 DIAGNOSIS — C61 Malignant neoplasm of prostate: Secondary | ICD-10-CM | POA: Diagnosis not present

## 2022-05-01 DIAGNOSIS — E663 Overweight: Secondary | ICD-10-CM | POA: Diagnosis not present

## 2022-05-01 DIAGNOSIS — Z87891 Personal history of nicotine dependence: Secondary | ICD-10-CM | POA: Diagnosis not present

## 2022-05-01 DIAGNOSIS — Z1331 Encounter for screening for depression: Secondary | ICD-10-CM | POA: Diagnosis not present

## 2022-05-01 DIAGNOSIS — K279 Peptic ulcer, site unspecified, unspecified as acute or chronic, without hemorrhage or perforation: Secondary | ICD-10-CM | POA: Diagnosis not present

## 2022-05-01 DIAGNOSIS — K5730 Diverticulosis of large intestine without perforation or abscess without bleeding: Secondary | ICD-10-CM | POA: Diagnosis not present

## 2022-05-01 DIAGNOSIS — E782 Mixed hyperlipidemia: Secondary | ICD-10-CM | POA: Diagnosis not present

## 2022-06-22 ENCOUNTER — Other Ambulatory Visit: Payer: Self-pay

## 2022-06-22 ENCOUNTER — Emergency Department (HOSPITAL_COMMUNITY)
Admission: EM | Admit: 2022-06-22 | Discharge: 2022-06-22 | Disposition: A | Discharge status: Home / Self Care | Payer: No Typology Code available for payment source | Attending: Emergency Medicine | Admitting: Emergency Medicine

## 2022-06-22 ENCOUNTER — Emergency Department (HOSPITAL_COMMUNITY): Payer: No Typology Code available for payment source

## 2022-06-22 ENCOUNTER — Encounter (HOSPITAL_COMMUNITY): Payer: Self-pay | Admitting: *Deleted

## 2022-06-22 DIAGNOSIS — Z6825 Body mass index (BMI) 25.0-25.9, adult: Secondary | ICD-10-CM | POA: Diagnosis not present

## 2022-06-22 DIAGNOSIS — R519 Headache, unspecified: Secondary | ICD-10-CM | POA: Diagnosis not present

## 2022-06-22 DIAGNOSIS — R202 Paresthesia of skin: Secondary | ICD-10-CM | POA: Diagnosis not present

## 2022-06-22 MED ORDER — METOCLOPRAMIDE HCL 5 MG/ML IJ SOLN
10.0000 mg | Freq: Once | INTRAMUSCULAR | Status: AC
  Administered 2022-06-22: 10 mg via INTRAVENOUS
  Filled 2022-06-22: qty 2

## 2022-06-22 MED ORDER — DIPHENHYDRAMINE HCL 50 MG/ML IJ SOLN
25.0000 mg | Freq: Once | INTRAMUSCULAR | Status: AC
  Administered 2022-06-22: 25 mg via INTRAVENOUS
  Filled 2022-06-22: qty 1

## 2022-06-22 MED ORDER — KETOROLAC TROMETHAMINE 30 MG/ML IJ SOLN
15.0000 mg | Freq: Once | INTRAMUSCULAR | Status: AC
  Administered 2022-06-22: 15 mg via INTRAVENOUS
  Filled 2022-06-22: qty 1

## 2022-06-22 MED ORDER — IBUPROFEN 800 MG PO TABS: 800.0000 mg | ORAL_TABLET | Freq: Three times a day (TID) | ORAL | 0 refills | Status: AC | PRN

## 2022-06-22 NOTE — ED Notes (Signed)
ED Provider at bedside. 

## 2022-06-22 NOTE — Discharge Instructions (Signed)
Follow-up with your family doctor next week for recheck. 

## 2022-06-22 NOTE — ED Provider Notes (Signed)
[place] Provider Note   CSN: [id number] Arrival date & time: 06/22/22  1055     History  Chief Complaint  Patient presents with   Headache    [name] is a 72 y.o. male.  Patient has a history of GERD.  Patient complains of a headache.  And he had a little numbness in his fingers which has improved  The history is provided by the patient and medical records. No language interpreter was used.  Headache Pain location:  Generalized Quality:  Dull Severity currently:  4/10 Severity at highest:  5/10 Onset quality:  Sudden Timing:  Constant Progression:  Waxing and waning Chronicity:  New Similar to prior headaches: no   Context: not activity   Relieved by:  Nothing Worsened by:  Nothing Associated symptoms: no abdominal pain, no back pain, no congestion, no cough, no diarrhea, no fatigue, no seizures and no sinus pressure        Home Medications Prior to Admission medications   Medication Sig Start Date End Date Taking? Authorizing Provider  ibuprofen (ADVIL) 800 MG tablet Take 1 tablet (800 mg total) by mouth every 8 (eight) hours as needed for headache. 06/22/22  Yes Milton Ferguson, MD  pantoprazole (PROTONIX) 40 MG tablet Take 1 tablet (40 mg total) by mouth daily before breakfast. Patient not taking: Reported on 05/06/2019 12/02/18   Rogene Houston, MD      Allergies    Codeine    Review of Systems   Review of Systems  Constitutional:  Negative for appetite change and fatigue.  HENT:  Negative for congestion, ear discharge and sinus pressure.   Eyes:  Negative for discharge.  Respiratory:  Negative for cough.   Cardiovascular:  Negative for chest pain.  Gastrointestinal:  Negative for abdominal pain and diarrhea.  Genitourinary:  Negative for frequency and hematuria.  Musculoskeletal:  Negative for back pain.  Skin:  Negative for rash.  Neurological:  Positive for headaches. Negative for seizures.   Psychiatric/Behavioral:  Negative for hallucinations.     Physical Exam Updated Vital Signs BP 126/82   Pulse 64   Temp 97.7 F (36.5 C) (Oral)   Resp 14   Ht 6\' 2"  (1.88 m)   Wt 84.4 kg   SpO2 99%   BMI 23.88 kg/m  Physical Exam Vitals and nursing note reviewed.  Constitutional:      Appearance: Normal appearance. He is well-developed.  HENT:     Head: Normocephalic.     Nose: Nose normal.  Eyes:     General: No scleral icterus.    Conjunctiva/sclera: Conjunctivae normal.  Neck:     Thyroid: No thyromegaly.  Cardiovascular:     Rate and Rhythm: Normal rate and regular rhythm.     Heart sounds: No murmur heard.    No friction rub. No gallop.  Pulmonary:     Breath sounds: No stridor. No wheezing or rales.  Chest:     Chest wall: No tenderness.  Abdominal:     General: There is no distension.     Tenderness: There is no abdominal tenderness. There is no rebound.  Musculoskeletal:        General: Normal range of motion.     Cervical back: Neck supple.  Lymphadenopathy:     Cervical: No cervical adenopathy.  Skin:    Findings: No erythema or rash.  Neurological:     Mental Status: He is alert and oriented to person,  place, and time.     Motor: No abnormal muscle tone.     Coordination: Coordination normal.  Psychiatric:        Behavior: Behavior normal.     ED Results / Procedures / Treatments   Labs (all labs ordered are listed, but only abnormal results are displayed) Labs Reviewed - No data to display  EKG None  Radiology CT Head Wo Contrast  Result Date: 06/22/2022 CLINICAL DATA:  Headache EXAM: CT HEAD WITHOUT CONTRAST TECHNIQUE: Contiguous axial images were obtained from the base of the skull through the vertex without intravenous contrast. RADIATION DOSE REDUCTION: This exam was performed according to the departmental dose-optimization program which includes automated exposure control, adjustment of the mA and/or kV according to patient size  and/or use of iterative reconstruction technique. COMPARISON:  8/6/23CT Head FINDINGS: Brain: No evidence of acute infarction, hemorrhage, hydrocephalus, extra-axial collection or mass lesion/mass effect. Is an enlarged and partially empty sella. Vascular: No disproportionately hyperdense vessel or unexpected calcification. Skull: Normal. Negative for fracture or focal lesion. Sinuses/Orbits: No middle ear or mastoid effusion. Paranasal sinuses are notable for mild mucosal thickening in the posterior ethmoid air cells on the left. Bilateral lens replacement. Orbits are otherwise unremarkable. Other: None. IMPRESSION: 1. No acute intracranial abnormality. 2. Enlarged and partially empty sella, which is nonspecific, but could be seen in the setting of idiopathic intracranial hypertension. 3. Electronically Signed   By: Marin Roberts M.D.   On: 06/22/2022 12:00    Procedures Procedures    Medications Ordered in ED Medications  ketorolac (TORADOL) 30 MG/ML injection 15 mg (15 mg Intravenous Given 06/22/22 1219)  metoCLOPramide (REGLAN) injection 10 mg (10 mg Intravenous Given 06/22/22 1220)  diphenhydrAMINE (BENADRYL) injection 25 mg (25 mg Intravenous Given 06/22/22 1221)    ED Course/ Medical Decision Making/ A&P                             Medical Decision Making Amount and/or Complexity of Data Reviewed Radiology: ordered.  Risk Prescription drug management.  This patient presents to the ED for concern of headache, this involves an extensive number of treatment options, and is a complaint that carries with it a high risk of complications and morbidity.  The differential diagnosis includes migraine headache, brain tumor   Co morbidities that complicate the patient evaluation  GERD   Additional history obtained:  Additional history obtained from patient External records from outside source obtained and reviewed including hospital records   Lab Tests:  No labs Imaging Studies  ordered:  I ordered imaging studies including CT head I independently visualized and interpreted imaging which showed no acute disease I agree with the radiologist interpretation   Cardiac Monitoring: / EKG:  The patient was maintained on a cardiac monitor.  I personally viewed and interpreted the cardiac monitored which showed an underlying rhythm of: Normal sinus rhythm   Consultations Obtained:  No consultant  Problem List / ED Course / Critical interventions / Medication management  Headache I ordered medication including Toradol for headache Reevaluation of the patient after these medicines showed that the patient improved I have reviewed the patients home medicines and have made adjustments as needed   Social Determinants of Health:  None   Test / Admission - Considered:  None  Patient with a headache.  He improved with migraine cocktail and will follow-up with PCP        Final Clinical Impression(s) / ED  Diagnoses Final diagnoses:  Bad headache    Rx / DC Orders ED Discharge Orders          Ordered    ibuprofen (ADVIL) 800 MG tablet  Every 8 hours PRN        06/22/22 1403              Milton Ferguson, MD 06/22/22 1742

## 2022-06-22 NOTE — ED Triage Notes (Signed)
Pt with frequent HA's recently but states head was different with waking up this morning at 0400, began to have tingling to left finger tips 0900 this morning.  Pt seen PCP and sent here. Denies any weakness.

## 2022-07-09 DIAGNOSIS — Z6826 Body mass index (BMI) 26.0-26.9, adult: Secondary | ICD-10-CM | POA: Diagnosis not present

## 2022-07-09 DIAGNOSIS — R519 Headache, unspecified: Secondary | ICD-10-CM | POA: Diagnosis not present

## 2022-07-09 DIAGNOSIS — E663 Overweight: Secondary | ICD-10-CM | POA: Diagnosis not present

## 2022-07-12 ENCOUNTER — Encounter: Payer: Self-pay | Admitting: Neurology

## 2022-07-18 ENCOUNTER — Ambulatory Visit: Payer: No Typology Code available for payment source | Attending: Internal Medicine | Admitting: Internal Medicine

## 2022-07-18 ENCOUNTER — Encounter: Payer: Self-pay | Admitting: Internal Medicine

## 2022-07-18 VITALS — BP 138/82 | HR 71 | Ht 74.0 in | Wt 191.6 lb

## 2022-07-18 DIAGNOSIS — R0789 Other chest pain: Secondary | ICD-10-CM | POA: Diagnosis not present

## 2022-07-18 DIAGNOSIS — R002 Palpitations: Secondary | ICD-10-CM | POA: Diagnosis not present

## 2022-07-18 NOTE — Patient Instructions (Signed)
Medication Instructions:  Your physician recommends that you continue on your current medications as directed. Please refer to the Current Medication list given to you today.  *If you need a refill on your cardiac medications before your next appointment, please call your pharmacy*   Lab Work: None If you have labs (blood work) drawn today and your tests are completely normal, you will receive your results only by: MyChart Message (if you have MyChart) OR A paper copy in the mail If you have any lab test that is abnormal or we need to change your treatment, we will call you to review the results.   Testing/Procedures: None   Follow-Up: At [facility], you and your health needs are our priority.  As part of our continuing mission to provide you with exceptional heart care, we have created designated Provider Care Teams.  These Care Teams include your primary Cardiologist (physician) and Advanced Practice Providers (APPs -  Physician Assistants and Nurse Practitioners) who all work together to provide you with the care you need, when you need it.  We recommend signing up for the patient portal called "MyChart".  Sign up information is provided on this After Visit Summary.  MyChart is used to connect with patients for Virtual Visits (Telemedicine).  Patients are able to view lab/test results, encounter notes, upcoming appointments, etc.  Non-urgent messages can be sent to your provider as well.   To learn more about what you can do with MyChart, go to [email or web address].    Your next appointment:   Follow up with Dr. Jenene Slicker as needed.  Other Instructions Please decrease caffeine intake (coffee) to see if your palpitations stop. If palpitations have not decreased after cutting out caffeine in 1 week, please call our office for Zio (monitor) placement.

## 2022-07-18 NOTE — Progress Notes (Signed)
Cardiology Office Note  Date: 07/18/2022   ID: Sirwilliam, Friedel 1951/01/17, MRN [id number]  PCP:  Assunta Found, MD  Cardiologist:  Marjo Bicker, MD Electrophysiologist:  None   Reason for Office Visit: Evaluation of palpitations at the request of Dr. Phillips Odor   History of Present Illness: [name] is a 72 y.o. male with no PMH was referred to cardiology clinic for evaluation of palpitations.  Patient reported he has GERD and was told to take antacid/PPI as needed but he is not doing it.  He reports having chest heaviness especially at night when he lays on his back.  Otherwise, he is physically active at baseline, cuts grass, mows lawn, performs physically strenuous activities with no symptoms of angina or DOE.  Due to his age, he said he takes breaks in between but not because of symptoms. He denies any dizziness, lightness, syncope.  He reports having palpitations for many years but recently noticed every day, 2-3 times per day and last for a minute or so.  He drinks 2 cups of coffee every day which is 8 ounces each. Denies smoking cigarettes and drug use. Drinks couple of beers every day.  Has been doing this all his adult life.  Has a family history of congestive heart failure, his brother passed away in 2018/07/31 with CHF, LVEF 25%, he dropped diet.  Past Medical History:  Diagnosis Date   Cancer    prostate   Fracture of finger of right hand    4th and 5 th digit   Knee fracture     Past Surgical History:  Procedure Laterality Date   COLONOSCOPY  11/28/2011   Procedure: COLONOSCOPY;  Surgeon: Malissa Hippo, MD;  [facility];  Service: Endoscopy;  Laterality: N/A;  930   COLONOSCOPY N/A 08/07/2017   Procedure: COLONOSCOPY;  Surgeon: Malissa Hippo, MD;  [facility];  Service: Endoscopy;  Laterality: N/A;  100   ESOPHAGOGASTRODUODENOSCOPY N/A 11/27/2018   Procedure: ESOPHAGOGASTRODUODENOSCOPY (EGD);  Surgeon: Malissa Hippo, MD;  [facility];  Service: Endoscopy;  Laterality: N/A;   PERCUTANEOUS PINNING N/A 02/12/2014   Procedure: CLOSED REDUCTION PERCUTANEOUS PINNING 4TH AND 5TH FINGERS;  Surgeon: Cheral Almas, MD;  [facility]: [facility];  Service: Orthopedics;  Laterality: N/A;   POLYPECTOMY  08/07/2017   Procedure: POLYPECTOMY;  Surgeon: Malissa Hippo, MD;  [facility];  Service: Endoscopy;;   PROSTATECTOMY  Jul 31, 2003   [facility]-Dr. Jerre Simon    Current Outpatient Medications  Medication Sig Dispense Refill   ibuprofen (ADVIL) 800 MG tablet Take 1 tablet (800 mg total) by mouth every 8 (eight) hours as needed for headache. 12 tablet 0   pantoprazole (PROTONIX) 40 MG tablet Take 1 tablet (40 mg total) by mouth daily before breakfast. 30 tablet 5   No current facility-administered medications for this visit.   Allergies:  Codeine   Social History: The patient  reports that he has quit smoking. His smoking use included cigarettes. He has a 45.00 pack-year smoking history. He has never used smokeless tobacco. He reports current alcohol use of about 7.0 standard drinks of alcohol per week. He reports current drug use. Drug: Marijuana.   Family History: The patient's family history includes Arthritis in an other family member; Asthma in an other family member; Colon cancer in his brother; Diabetes in an other family member.   ROS:  Please see the history of present illness. Otherwise,  complete review of systems is positive for none.  All other systems are reviewed and negative.   Physical Exam: VS:  BP 138/82   Pulse 71   Ht 6\' 2"  (1.88 m)   Wt 191 lb 9.6 oz (86.9 kg)   SpO2 95%   BMI 24.60 kg/m , BMI Body mass index is 24.6 kg/m.  Wt Readings from Last 3 Encounters:  07/18/22 191 lb 9.6 oz (86.9 kg)  06/22/22 186 lb (84.4 kg)  11/12/21 175 lb (79.4 kg)    General: Patient appears comfortable at rest. HEENT: Conjunctiva and lids normal, oropharynx clear with moist mucosa. Neck: Supple, no  elevated JVP or carotid bruits, no thyromegaly. Lungs: Clear to auscultation, nonlabored breathing at rest. Cardiac: Regular rate and rhythm, no S3 or significant systolic murmur, no pericardial rub. Abdomen: Soft, nontender, no hepatomegaly, bowel sounds present, no guarding or rebound. Extremities: No pitting edema, distal pulses 2+. Skin: Warm and dry. Musculoskeletal: No kyphosis. Neuropsychiatric: Alert and oriented x3, affect grossly appropriate.  ECG:  An ECG dated 06/2022 was personally reviewed today and demonstrated:  NSR  Recent Labwork: 11/12/2021: ALT 18; AST 26; BUN 21; Creatinine, Ser 1.09; Hemoglobin 13.9; Platelets 224; Potassium 3.5; Sodium 139  No results found for: "CHOL", "TRIG", "HDL", "CHOLHDL", "VLDL", "LDLCALC", "LDLDIRECT"  Other Studies Reviewed Today:   Assessment and Plan: Patient is a 72 year old M with no PMH was referred to cardiology clinic for evaluation of palpitations.  # Palpitations: Daily palpitations lasting for a minute and occurring 2-3 times per day.  Likely secondary to coffee use.  Instructed patient to cut down coffee intake and evaluate his palpitations. Paroxysmal atrial fibrillation cannot be ruled out completely due to history of alcohol use all his adult life, drinking couple beers every day. If he still continues to have palpitations despite reduction/cessation of coffee use, he is instructed to call the clinic so I can mail 1 week event monitor to rule out paroxysmal A-fib. # Chest heaviness at rest, noncardiac likely secondary to GERD: Patient has chest heaviness at night when he lays on his back but no chest heaviness when he cuts grass or mows lawn or performs any other strenuous activities. I do not think this is cardiac in nature and does not need further cardiac testing at this time.  I have spent a total of 30 minutes with patient reviewing chart, EKGs, labs and examining patient as well as establishing an assessment and plan that was  discussed with the patient.  > 50% of time was spent in direct patient care.     Medication Adjustments/Labs and Tests Ordered: Current medicines are reviewed at length with the patient today.  Concerns regarding medicines are outlined above.   Tests Ordered: No orders of the defined types were placed in this encounter.   Medication Changes: No orders of the defined types were placed in this encounter.   Disposition:  Follow up prn  Signed, Addilee Neu Verne SpurrPriya Sher Hellinger, MD, 07/18/2022 8:09 AM    [facility] [address], [place], [place] [address]

## 2022-08-23 NOTE — Progress Notes (Deleted)
NEUROLOGY CONSULTATION NOTE  [name] MRN: [id number] DOB: [birth year]  Referring provider: Assunta Found, MD Primary care provider: Assunta Found, MD  Reason for consult:  headache  Assessment/Plan:   ***   Subjective:  [name] is a 72 year old male with history of prostate cancer who presents for headache.  History supplemented by ED note and referring provider's note.  CT head personally reviewed.   He has had headaches off and on for years with head CTs dating back to at least 2006.  ***.  He was evaluated in the ED on 3/15, where CT head showed partially empty sella but no acute intracranial abnormality.    PAST MEDICAL HISTORY: Past Medical History:  Diagnosis Date   Cancer [facility])    prostate   Fracture of finger of right hand    4th and 5 th digit   Knee fracture     PAST SURGICAL HISTORY: Past Surgical History:  Procedure Laterality Date   COLONOSCOPY  11/28/2011   Procedure: COLONOSCOPY;  Surgeon: Malissa Hippo, MD;  [facility];  Service: Endoscopy;  Laterality: N/A;  930   COLONOSCOPY N/A 08/07/2017   Procedure: COLONOSCOPY;  Surgeon: Malissa Hippo, MD;  [facility];  Service: Endoscopy;  Laterality: N/A;  100   ESOPHAGOGASTRODUODENOSCOPY N/A 11/27/2018   Procedure: ESOPHAGOGASTRODUODENOSCOPY (EGD);  Surgeon: Malissa Hippo, MD;  [facility];  Service: Endoscopy;  Laterality: N/A;   PERCUTANEOUS PINNING N/A 02/12/2014   Procedure: CLOSED REDUCTION PERCUTANEOUS PINNING 4TH AND 5TH FINGERS;  Surgeon: Cheral Almas, MD;  [facility];  Service: Orthopedics;  Laterality: N/A;   POLYPECTOMY  08/07/2017   Procedure: POLYPECTOMY;  Surgeon: Malissa Hippo, MD;  [facility];  Service: Endoscopy;;   PROSTATECTOMY  2005   [facility]-Dr. Jerre Simon    MEDICATIONS: Current Outpatient Medications on File Prior to Visit  Medication Sig Dispense Refill   ibuprofen (ADVIL) 800 MG tablet Take 1 tablet  (800 mg total) by mouth every 8 (eight) hours as needed for headache. 12 tablet 0   pantoprazole (PROTONIX) 40 MG tablet Take 1 tablet (40 mg total) by mouth daily before breakfast. 30 tablet 5   No current facility-administered medications on file prior to visit.    ALLERGIES: Allergies  Allergen Reactions   Codeine Itching    FAMILY HISTORY: Family History  Problem Relation Age of Onset   Colon cancer Brother    Asthma Other    Diabetes Other    Arthritis Other     Objective:  *** General: No acute distress.  Patient appears well-groomed.   Head:  Normocephalic/atraumatic Eyes:  fundi examined but not visualized Neck: supple, no paraspinal tenderness, full range of motion Back: No paraspinal tenderness Heart: regular rate and rhythm Lungs: Clear to auscultation bilaterally. Vascular: No carotid bruits. Neurological Exam: Mental status: alert and oriented to person, place, and time, speech fluent and not dysarthric, language intact. Cranial nerves: CN I: not tested CN II: pupils equal, round and reactive to light, visual fields intact CN III, IV, VI:  full range of motion, no nystagmus, no ptosis CN V: facial sensation intact. CN VII: upper and lower face symmetric CN VIII: hearing intact CN IX, X: gag intact, uvula midline CN XI: sternocleidomastoid and trapezius muscles intact CN XII: tongue midline Bulk & Tone: normal, no fasciculations. Motor:  muscle strength 5/5 throughout Sensation:  Pinprick, temperature and vibratory sensation intact. Deep Tendon  Reflexes:  2+ throughout,  toes downgoing.   Finger to nose testing:  Without dysmetria.   Heel to shin:  Without dysmetria.   Gait:  Normal station and stride.  Romberg negative.    Thank you for allowing me to take part in the care of this patient.  Shon Millet, DO  CC: ***

## 2022-08-24 ENCOUNTER — Ambulatory Visit: Payer: No Typology Code available for payment source | Admitting: Neurology

## 2022-08-24 ENCOUNTER — Encounter: Payer: Self-pay | Admitting: Neurology

## 2022-11-20 ENCOUNTER — Ambulatory Visit: Payer: No Typology Code available for payment source | Admitting: Neurology

## 2023-02-27 DIAGNOSIS — H524 Presbyopia: Secondary | ICD-10-CM | POA: Diagnosis not present

## 2023-02-27 DIAGNOSIS — Z0100 Encounter for examination of eyes and vision without abnormal findings: Secondary | ICD-10-CM | POA: Diagnosis not present

## 2023-03-18 ENCOUNTER — Encounter: Payer: Self-pay | Admitting: Neurology

## 2023-04-10 ENCOUNTER — Emergency Department (HOSPITAL_COMMUNITY): Payer: No Typology Code available for payment source

## 2023-04-10 ENCOUNTER — Emergency Department (HOSPITAL_COMMUNITY)
Admission: EM | Admit: 2023-04-10 | Discharge: 2023-04-10 | Disposition: A | Discharge status: Home / Self Care | Payer: No Typology Code available for payment source | Attending: Emergency Medicine | Admitting: Emergency Medicine

## 2023-04-10 ENCOUNTER — Other Ambulatory Visit: Payer: Self-pay

## 2023-04-10 DIAGNOSIS — R519 Headache, unspecified: Secondary | ICD-10-CM | POA: Diagnosis not present

## 2023-04-10 LAB — BASIC METABOLIC PANEL
Anion gap: 11 (ref 5–15)
BUN: 15 mg/dL (ref 8–23)
CO2: 20 mmol/L — ABNORMAL LOW (ref 22–32)
Calcium: 9.4 mg/dL (ref 8.9–10.3)
Chloride: 107 mmol/L (ref 98–111)
Creatinine, Ser: 1.08 mg/dL (ref 0.61–1.24)
GFR, Estimated: >60 mL/min (ref >60)
Glucose, Bld: 127 mg/dL — ABNORMAL HIGH (ref 70–99)
Potassium: 4.1 mmol/L (ref 3.5–5.1)
Sodium: 138 mmol/L (ref 135–145)

## 2023-04-10 LAB — CBC
HCT: 44.5 % (ref 39.0–52.0)
Hemoglobin: 15.2 g/dL (ref 13.0–17.0)
MCH: 30.7 pg (ref 26.0–34.0)
MCHC: 34.2 g/dL (ref 30.0–36.0)
MCV: 89.9 fL (ref 80.0–100.0)
Platelets: 257 10*3/uL (ref 150–400)
RBC: 4.95 MIL/uL (ref 4.22–5.81)
RDW: 13.1 % (ref 11.5–15.5)
WBC: 10.8 10*3/uL — ABNORMAL HIGH (ref 4.0–10.5)
nRBC: 0 % (ref 0.0–0.2)

## 2023-04-10 MED ORDER — METOCLOPRAMIDE HCL 5 MG/ML IJ SOLN: 5.0000 mg | Freq: Once | INTRAMUSCULAR | Status: DC

## 2023-04-10 MED ORDER — DIPHENHYDRAMINE HCL 50 MG/ML IJ SOLN
12.5000 mg | Freq: Once | INTRAMUSCULAR | Status: AC
  Administered 2023-04-10: 12.5 mg via INTRAMUSCULAR
  Filled 2023-04-10: qty 1

## 2023-04-10 MED ORDER — METOCLOPRAMIDE HCL 5 MG/ML IJ SOLN
5.0000 mg | Freq: Once | INTRAMUSCULAR | Status: AC
  Administered 2023-04-10: 5 mg via INTRAMUSCULAR
  Filled 2023-04-10: qty 2

## 2023-04-10 MED ORDER — DEXAMETHASONE 4 MG PO TABS
10.0000 mg | ORAL_TABLET | Freq: Once | ORAL | Status: AC
  Administered 2023-04-10: 10 mg via ORAL
  Filled 2023-04-10: qty 3

## 2023-04-10 NOTE — Discharge Instructions (Signed)
 I have given you medication for your headache here.  Hopefully it stops it and it does not come back.  Please follow-up with the neurologist in the office either way.  Let your family doctor know about your visit here and see when they want to see you again.

## 2023-04-10 NOTE — ED Triage Notes (Signed)
 Patient complains of nightly headaches x 6 months. Taking OTC medication with improvement. States seen at [facility] for same without significant findings. Denies vision changes or dizziness.

## 2023-04-10 NOTE — ED Provider Notes (Signed)
 [facility] AT [facility] Provider Note   CSN: [id number] Arrival date & time: 04/10/23  0746     History  Chief Complaint  Patient presents with   Headache    [name] is a 73 y.o. male.  73 yo M with a chief complaint of left-sided headache.  This been going on he thinks for at least 6 months.  He was seen in the [facility] emergency department about 4 months ago had CT imaging that was unremarkable.  With ongoing symptoms decided come here today to be evaluated.  He denies injury denies one-sided numbness or weakness denies difficulty speech or swallowing.  Seems to notice mostly at night.  Nothing seems to make it worse.  He takes Tylenol  but says he takes it maybe once a day and mostly at night.  He denies any visual symptoms.  Denies pain with chewing.   Headache      Home Medications Prior to Admission medications   Medication Sig Start Date End Date Taking? Authorizing Provider  ibuprofen  (ADVIL ) 800 MG tablet Take 1 tablet (800 mg total) by mouth every 8 (eight) hours as needed for headache. 06/22/22   Suzette Pac, MD  pantoprazole  (PROTONIX ) 40 MG tablet Take 1 tablet (40 mg total) by mouth daily before breakfast. 12/02/18   [name], Claudis PENNER, MD      Allergies    Codeine    Review of Systems   Review of Systems  Neurological:  Positive for headaches.    Physical Exam Updated Vital Signs BP (!) 140/91 (BP Location: Right Arm)   Pulse 75   Temp 97.8 F (36.6 C) (Oral)   Resp 15   Ht 6' 2 (1.88 m)   Wt 81.6 kg   SpO2 99%   BMI 23.11 kg/m  Physical Exam Vitals and nursing note reviewed.  Constitutional:      Appearance: He is well-developed.  HENT:     Head: Normocephalic and atraumatic.  Eyes:     Pupils: Pupils are equal, round, and reactive to light.  Neck:     Vascular: No JVD.  Cardiovascular:     Rate and Rhythm: Normal rate and regular rhythm.     Heart sounds: No murmur heard.    No friction rub. No  gallop.  Pulmonary:     Effort: No respiratory distress.     Breath sounds: No wheezing.  Abdominal:     General: There is no distension.     Tenderness: There is no abdominal tenderness. There is no guarding or rebound.  Musculoskeletal:        General: Normal range of motion.     Cervical back: Normal range of motion and neck supple.  Skin:    Coloration: Skin is not pale.     Findings: No rash.  Neurological:     Mental Status: He is alert and oriented to person, place, and time.     Cranial Nerves: Cranial nerves 2-12 are intact.     Sensory: Sensation is intact.     Motor: Motor function is intact.     Coordination: Coordination is intact.     Comments: Benign neurologic exam.  Ambulates without issue.  Psychiatric:        Behavior: Behavior normal.     ED Results / Procedures / Treatments   Labs (all labs ordered are listed, but only abnormal results are displayed) Labs Reviewed  CBC - Abnormal; Notable for the following components:  Result Value   WBC 10.8 (*)    All other components within normal limits  BASIC METABOLIC PANEL - Abnormal; Notable for the following components:   CO2 20 (*)    Glucose, Bld 127 (*)    All other components within normal limits    EKG None  Radiology CT Head Wo Contrast Result Date: 04/10/2023 CLINICAL DATA:  Headache without red flag symptoms. Nightly headache for 6 months. EXAM: CT HEAD WITHOUT CONTRAST TECHNIQUE: Contiguous axial images were obtained from the base of the skull through the vertex without intravenous contrast. RADIATION DOSE REDUCTION: This exam was performed according to the departmental dose-optimization program which includes automated exposure control, adjustment of the mA and/or kV according to patient size and/or use of iterative reconstruction technique. COMPARISON:  Head CT 06/22/2022 FINDINGS: Brain: No evidence of acute infarction, hemorrhage, hydrocephalus, extra-axial collection or mass lesion/mass  effect. Partially empty sella, nonspecific in isolation. Vascular: No hyperdense vessel or unexpected calcification. Skull: Normal. Negative for fracture or focal lesion. Sinuses/Orbits: No acute finding. IMPRESSION: No acute finding or specific cause for headache. Electronically Signed   By: Dorn Roulette M.D.   On: 04/10/2023 08:36    Procedures Procedures    Medications Ordered in ED Medications  metoCLOPramide  (REGLAN ) injection 5 mg (has no administration in time range)  diphenhydrAMINE  (BENADRYL ) injection 12.5 mg (has no administration in time range)  dexamethasone  (DECADRON ) tablet 10 mg (has no administration in time range)    ED Course/ Medical Decision Making/ A&P                                 Medical Decision Making Risk Prescription drug management.   72 yo M with a chief complaints of left-sided headache.  This been going on for about 6 months.  Patient is well-appearing nontoxic.  Has benign neurologic exam.  He had a workup obtained through the [facility] process.  CT of the head negative for acute intracranial pathology.  He had blood work as well without obvious acute anemia or acute electrolyte abnormalities.  It seems less likely to be rebound headache based on the timing of his medications.  He has no jaw claudication no pain at the temporal artery I think it is unlikely to be temporal arteritis.  With him having ongoing symptoms will refer him to neurology.  Headache cocktail here. 12:05 PM:  I have discussed the diagnosis/risks/treatment options with the patient and family.  Evaluation and diagnostic testing in the emergency department does not suggest an emergent condition requiring admission or immediate intervention beyond what has been performed at this time.  They will follow up with PCP, neuro. We also discussed returning to the ED immediately if new or worsening sx occur. We discussed the sx which are most concerning (e.g., sudden worsening pain, fever, inability  to tolerate by mouth) that necessitate immediate return. Medications administered to the patient during their visit and any new prescriptions provided to the patient are listed below.  Medications given during this visit Medications  metoCLOPramide  (REGLAN ) injection 5 mg (has no administration in time range)  diphenhydrAMINE  (BENADRYL ) injection 12.5 mg (has no administration in time range)  dexamethasone  (DECADRON ) tablet 10 mg (has no administration in time range)     The patient appears reasonably screen and/or stabilized for discharge and I doubt any other medical condition or other [facility] requiring further screening, evaluation, or treatment in the ED at this time  prior to discharge.           Final Clinical Impression(s) / ED Diagnoses Final diagnoses:  Left-sided headache    Rx / DC Orders ED Discharge Orders          Ordered    Ambulatory referral to Neurology       Comments: 4 mos of new headache   04/10/23 1148              [place], [place] 04/10/23 1205

## 2023-04-17 DIAGNOSIS — Z6826 Body mass index (BMI) 26.0-26.9, adult: Secondary | ICD-10-CM | POA: Diagnosis not present

## 2023-04-17 DIAGNOSIS — E663 Overweight: Secondary | ICD-10-CM | POA: Diagnosis not present

## 2023-04-17 DIAGNOSIS — R519 Headache, unspecified: Secondary | ICD-10-CM | POA: Diagnosis not present

## 2023-04-17 DIAGNOSIS — R202 Paresthesia of skin: Secondary | ICD-10-CM | POA: Diagnosis not present

## 2023-05-06 DIAGNOSIS — Z0001 Encounter for general adult medical examination with abnormal findings: Secondary | ICD-10-CM | POA: Diagnosis not present

## 2023-05-06 DIAGNOSIS — E7849 Other hyperlipidemia: Secondary | ICD-10-CM | POA: Diagnosis not present

## 2023-05-06 DIAGNOSIS — E782 Mixed hyperlipidemia: Secondary | ICD-10-CM | POA: Diagnosis not present

## 2023-05-06 DIAGNOSIS — R7309 Other abnormal glucose: Secondary | ICD-10-CM | POA: Diagnosis not present

## 2023-05-06 DIAGNOSIS — Z1331 Encounter for screening for depression: Secondary | ICD-10-CM | POA: Diagnosis not present

## 2023-05-06 DIAGNOSIS — K279 Peptic ulcer, site unspecified, unspecified as acute or chronic, without hemorrhage or perforation: Secondary | ICD-10-CM | POA: Diagnosis not present

## 2023-05-06 DIAGNOSIS — Z6826 Body mass index (BMI) 26.0-26.9, adult: Secondary | ICD-10-CM | POA: Diagnosis not present

## 2023-05-06 DIAGNOSIS — C61 Malignant neoplasm of prostate: Secondary | ICD-10-CM | POA: Diagnosis not present

## 2023-05-06 DIAGNOSIS — E663 Overweight: Secondary | ICD-10-CM | POA: Diagnosis not present

## 2023-05-22 NOTE — Progress Notes (Unsigned)
NEUROLOGY CONSULTATION NOTE  THANE AGE MRN: [id number] DOB: [birth year]  Referring provider: Assunta Found, MD Primary care provider: Assunta Found, MD  Reason for consult:  headache  Assessment/Plan:   Left hemispheric headache.  Unclear etiology.  May be tension type.  As headaches occur later in the day after a coughing fit, wouldn't be classified as a primary coughing headache.  However, the coughing may be a trigger for tension type headache.  No associated neck pain to suggest cervicogenic component.  Semiology not consistent with migraine or trigeminal autonomic cephalgia.  As these headaches have been ongoing for years, not temporal and not associated with visual disturbance, temporal arteritis not suspected.   Will check CTA of head to rule out possible intracranial vascular etiology, such as aneurysm. Advised to discontinue smoking marijuana to avoid coughing and see if headaches resolve. Further recommendations pending results.  If headaches worsen despite discontinuing smoking, advised to contact me and follow up.   Subjective:  [name] is a 73 year old right-handed male with history of prostate cancer who presents for headaches.  History supplemented by referring provider's note.  He has had these headaches off and on for over 20 years.  More frequent over the past year.  He describes moderate-severe left sided posterior temporal/parietal throbbing/aching headache.  No associated visual disturbance, nausea, vomiting, autonomic symptoms, photophobia or phonophobia.  Denies neck pain or paresthesias over the scalp.  Lasts anywhere from a couple of hours to all day.  They have occurred off and on throughout the years.  Most recently occurs maybe 2-3 days a week.  They have been more mild to moderate now, but were severe in late December, requiring visit to the ED.  He does smoke marijuana and headaches occur after coughing fit from smoking.  However, the headache start later  in the day, not immediately after coughing.    Has had multiple head CTs over the years for evaluation of headache going back to 2004.  MRI of brain from 12/10/2002 revealed no acute intracranial abnormality.  Most recent imaging, a CT head, on 04/10/2023, personally reviewed, showed no acute findings.    Current NSAIDS/analgesics:  ibuprofen Current triptans:  none Current ergotamine:  none Current anti-emetic:  none Current muscle relaxants:  none Current Antihypertensive medications:  none Current Antidepressant medications:  none Current Anticonvulsant medications:  none Current anti-CGRP:  none Other therapy:  none   PAST MEDICAL HISTORY: Past Medical History:  Diagnosis Date   Cancer (HCC)    prostate   Fracture of finger of right hand    4th and 5 th digit   Knee fracture     PAST SURGICAL HISTORY: Past Surgical History:  Procedure Laterality Date   COLONOSCOPY  11/28/2011   Procedure: COLONOSCOPY;  Surgeon: Malissa Hippo, MD;  [facility];  Service: Endoscopy;  Laterality: N/A;  930   COLONOSCOPY N/A 08/07/2017   Procedure: COLONOSCOPY;  Surgeon: Malissa Hippo, MD;  [facility]: [facility];  Service: Endoscopy;  Laterality: N/A;  100   ESOPHAGOGASTRODUODENOSCOPY N/A 11/27/2018   Procedure: ESOPHAGOGASTRODUODENOSCOPY (EGD);  Surgeon: Malissa Hippo, MD;  [facility];  Service: Endoscopy;  Laterality: N/A;   PERCUTANEOUS PINNING N/A 02/12/2014   Procedure: CLOSED REDUCTION PERCUTANEOUS PINNING 4TH AND 5TH FINGERS;  Surgeon: Cheral Almas, MD;  Location: [facility];  Service: Orthopedics;  Laterality: N/A;   POLYPECTOMY  08/07/2017   Procedure: POLYPECTOMY;  Surgeon: Malissa Hippo, MD;  [facility];  Service: Endoscopy;;   PROSTATECTOMY  2005   [facility]-Dr. Jerre Simon    MEDICATIONS: Current Outpatient Medications on File Prior to Visit  Medication Sig Dispense Refill   ibuprofen (ADVIL) 800 MG tablet Take 1 tablet (800 mg  total) by mouth every 8 (eight) hours as needed for headache. 12 tablet 0   pantoprazole (PROTONIX) 40 MG tablet Take 1 tablet (40 mg total) by mouth daily before breakfast. 30 tablet 5   No current facility-administered medications on file prior to visit.    ALLERGIES: Allergies  Allergen Reactions   Codeine Itching    FAMILY HISTORY: Family History  Problem Relation Age of Onset   Colon cancer Brother    Asthma Other    Diabetes Other    Arthritis Other     Objective:  Blood pressure 121/86, pulse 73, height 6\' 2"  (1.88 m), weight 181 lb (82.1 kg), SpO2 97%. General: No acute distress.  Patient appears well-groomed.   Head:  Normocephalic/atraumatic Eyes:  fundi examined but not visualized Neck: supple, no paraspinal tenderness, full range of motion Heart: regular rate and rhythm Lungs: Clear to auscultation bilaterally. Vascular: No carotid bruits. Neurological Exam: Mental status: alert and oriented to person, place, and time, speech fluent and not dysarthric, language intact. Cranial nerves: CN I: not tested CN II: pupils equal, round and reactive to light, visual fields intact CN III, IV, VI:  full range of motion, no nystagmus, no ptosis CN V: facial sensation intact. CN VII: upper and lower face symmetric CN VIII: hearing intact CN IX, X: gag intact, uvula midline CN XI: sternocleidomastoid and trapezius muscles intact CN XII: tongue midline Bulk & Tone: normal, no fasciculations. Motor:  muscle strength 5/5 throughout Sensation:  Pinprick and vibratory sensation intact. Deep Tendon Reflexes:  2+ throughout,  toes downgoing.   Finger to nose testing:  Without dysmetria.   Gait:  Normal station and stride.  Romberg negative.    Thank you for allowing me to take part in the care of this patient.  Shon Millet, DO  CC: Assunta Found, MD

## 2023-05-23 ENCOUNTER — Ambulatory Visit (INDEPENDENT_AMBULATORY_CARE_PROVIDER_SITE_OTHER): Payer: No Typology Code available for payment source | Admitting: Neurology

## 2023-05-23 ENCOUNTER — Encounter: Payer: Self-pay | Admitting: Neurology

## 2023-05-23 VITALS — BP 121/86 | HR 73 | Ht 74.0 in | Wt 181.0 lb

## 2023-05-23 DIAGNOSIS — R519 Headache, unspecified: Secondary | ICD-10-CM | POA: Diagnosis not present

## 2023-05-23 NOTE — Patient Instructions (Addendum)
CHECK CTA HEAD. We have sent a referral to [facility] for your MRI and they will call you directly to schedule your appointment. They are located at [address] [facility]. If you need to contact them directly please call [phone].  AVOID SMOKING FURTHER RECOMMENDATIONS PENDING RESULTS IF HEADACHES PERSIST DESPITE AVOIDING SMOKING, CONTACT ME

## 2023-06-19 ENCOUNTER — Ambulatory Visit
Admission: RE | Admit: 2023-06-19 | Discharge: 2023-06-19 | Disposition: A | Discharge status: Home / Self Care | Payer: No Typology Code available for payment source | Source: Ambulatory Visit | Attending: Neurology | Admitting: Neurology

## 2023-06-19 DIAGNOSIS — R519 Headache, unspecified: Secondary | ICD-10-CM

## 2023-06-19 MED ORDER — IOPAMIDOL (ISOVUE-370) INJECTION 76%
75.0000 mL | Freq: Once | INTRAVENOUS | Status: AC | PRN
  Administered 2023-06-19: 75 mL via INTRAVENOUS

## 2023-07-02 ENCOUNTER — Ambulatory Visit: Payer: No Typology Code available for payment source | Admitting: Neurology

## 2023-07-17 ENCOUNTER — Ambulatory Visit: Payer: No Typology Code available for payment source | Admitting: Diagnostic Neuroimaging

## 2023-11-20 ENCOUNTER — Encounter: Payer: Self-pay | Admitting: Diagnostic Neuroimaging

## 2023-11-20 ENCOUNTER — Ambulatory Visit: Admitting: Diagnostic Neuroimaging

## 2023-11-20 VITALS — BP 128/88 | HR 76 | Ht 74.0 in | Wt 173.4 lb

## 2023-11-20 DIAGNOSIS — G4483 Primary cough headache: Secondary | ICD-10-CM | POA: Diagnosis not present

## 2023-11-20 DIAGNOSIS — M5481 Occipital neuralgia: Secondary | ICD-10-CM | POA: Diagnosis not present

## 2023-11-20 NOTE — Progress Notes (Signed)
 [facility]  PATIENT: [name] DOB: [birth year]  REFERRING CLINICIAN: Marvine Rush, MD HISTORY FROM: patient  REASON FOR VISIT: new consult   HISTORICAL  CHIEF COMPLAINT:  Chief Complaint  Patient presents with   New Patient (Initial Visit)    RM 6, Pt alone. Referred by PCP for headaches. Reports having a HA almost everyday. Reports taking acetaminophen  650 mg and it helps for most of the day but HA comes back.    HISTORY OF PRESENT ILLNESS:   73 year old male here for evaluation of headaches.  Symptoms started 1 to 2 years ago with left occipital parietal sharp shooting pains typically triggered by coughing spasms, which are intermittent typically triggered by smoking cannabis.  Patient has been smoking cannabis since 1960s, almost on daily basis.  However he notes that the cannabis nowadays is much stronger and tends to make him cough much more.  Sometimes when he takes 2 to 3 days off of smoking, he has less coughing spasms, and less headaches.  Denies any nausea or vomiting, sensitive light or sound or any other associated factors.  Headache symptoms are very brief and therefore he does not need to take a medication for it.  Sometimes he has some duller sensations that persist and he will take some Tylenol  for this.   REVIEW OF SYSTEMS: Full 14 system review of systems performed and negative with exception of: As per HPI.  ALLERGIES: Allergies  Allergen Reactions   Codeine Itching    HOME MEDICATIONS: Outpatient Medications Prior to Visit  Medication Sig Dispense Refill   ibuprofen  (ADVIL ) 800 MG tablet Take 1 tablet (800 mg total) by mouth every 8 (eight) hours as needed for headache. (Patient not taking: Reported on 11/20/2023) 12 tablet 0   pantoprazole  (PROTONIX ) 40 MG tablet Take 1 tablet (40 mg total) by mouth daily before breakfast. (Patient not taking: Reported on 11/20/2023) 30 tablet 5   No facility-administered medications prior to  visit.    PAST MEDICAL HISTORY: Past Medical History:  Diagnosis Date   Cancer [facility])    prostate   Fracture of finger of right hand    4th and 5 th digit   Knee fracture     PAST SURGICAL HISTORY: Past Surgical History:  Procedure Laterality Date   COLONOSCOPY  11/28/2011   Procedure: COLONOSCOPY;  Surgeon: Claudis RAYMOND Rivet, MD;  [facility];  Service: Endoscopy;  Laterality: N/A;  930   COLONOSCOPY N/A 08/07/2017   Procedure: COLONOSCOPY;  Surgeon: Rivet Claudis RAYMOND, MD;  [facility];  Service: Endoscopy;  Laterality: N/A;  100   ESOPHAGOGASTRODUODENOSCOPY N/A 11/27/2018   Procedure: ESOPHAGOGASTRODUODENOSCOPY (EGD);  Surgeon: Rivet Claudis RAYMOND, MD;  [facility];  Service: Endoscopy;  Laterality: N/A;   PERCUTANEOUS PINNING N/A 02/12/2014   Procedure: CLOSED REDUCTION PERCUTANEOUS PINNING 4TH AND 5TH FINGERS;  Surgeon: Kay Ozell Cummins, MD;  Location: [facility];  Service: Orthopedics;  Laterality: N/A;   POLYPECTOMY  08/07/2017   Procedure: POLYPECTOMY;  Surgeon: Rivet Claudis RAYMOND, MD;  [facility];  Service: Endoscopy;;   PROSTATECTOMY  2005   [facility]-Dr. Javaid    FAMILY HISTORY: Family History  Problem Relation Age of Onset   Stroke Mother    Colon cancer Brother    Asthma Other    Diabetes Other    Arthritis Other     SOCIAL HISTORY: Social History   Socioeconomic History   Marital status: Married  Spouse name: Not on file   Number of children: Not on file   Years of education: 11   Highest education level: Not on file  Occupational History    Employer: [employer]    Comment: Retired  Tobacco Use   Smoking status: Former    Current packs/day: 1.00    Average packs/day: 1 pack/day for 45.0 years (45.0 ttl pk-yrs)    Types: Cigarettes   Smokeless tobacco: Never  Vaping Use   Vaping status: Never Used  Substance and Sexual Activity   Alcohol use: Yes    Alcohol/week: 7.0 standard drinks of alcohol     Types: 7 Cans of beer per week    Comment: 1-2 beers a day   Drug use: Yes    Types: Marijuana    Comment: Daily use   Sexual activity: Not on file  Other Topics Concern   Not on file  Social History Narrative   Right handed   Social Drivers of Health   Financial Resource Strain: Not on file  Food Insecurity: Not on file  Transportation Needs: Not on file  Physical Activity: Not on file  Stress: Not on file  Social Connections: Not on file  Intimate Partner Violence: Not on file     PHYSICAL EXAM  GENERAL EXAM/CONSTITUTIONAL: Vitals:  Vitals:   11/20/23 0921  BP: 128/88  Pulse: 76  Weight: 173 lb 6.4 oz (78.7 kg)  Height: 6' 2 (1.88 m)   Body mass index is 22.26 kg/m. Wt Readings from Last 3 Encounters:  11/20/23 173 lb 6.4 oz (78.7 kg)  05/23/23 181 lb (82.1 kg)  04/10/23 180 lb (81.6 kg)   Patient is in no distress; well developed, nourished and groomed; neck is supple  CARDIOVASCULAR: Examination of carotid arteries is normal; no carotid bruits Regular rate and rhythm, no murmurs Examination of peripheral vascular system by observation and palpation is normal  EYES: Ophthalmoscopic exam of optic discs and posterior segments is normal; no papilledema or hemorrhages No results found.  MUSCULOSKELETAL: Gait, strength, tone, movements noted in Neurologic exam below  NEUROLOGIC: MENTAL STATUS:      No data to display         awake, alert, oriented to person, place and time recent and remote memory intact normal attention and concentration language fluent, comprehension intact, naming intact fund of knowledge appropriate  CRANIAL NERVE:  2nd - no papilledema on fundoscopic exam 2nd, 3rd, 4th, 6th - pupils equal and reactive to light, visual fields full to confrontation, extraocular muscles intact, no nystagmus 5th - facial sensation symmetric 7th - facial strength symmetric 8th - hearing intact 9th - palate elevates symmetrically, uvula  midline 11th - shoulder shrug symmetric 12th - tongue protrusion midline  MOTOR:  normal bulk and tone, full strength in the BUE, BLE  SENSORY:  normal and symmetric to light touch, temperature, vibration  COORDINATION:  finger-nose-finger, fine finger movements normal  REFLEXES:  deep tendon reflexes present and symmetric  GAIT/STATION:  narrow based gait     DIAGNOSTIC DATA (LABS, IMAGING, TESTING) - I reviewed patient records, labs, notes, testing and imaging myself where available.  Lab Results  Component Value Date   WBC 10.8 (H) 04/10/2023   HGB 15.2 04/10/2023   HCT 44.5 04/10/2023   MCV 89.9 04/10/2023   PLT 257 04/10/2023      Component Value Date/Time   NA 138 04/10/2023 0802   K 4.1 04/10/2023 0802   CL 107 04/10/2023 0802  CO2 20 (L) 04/10/2023 0802   GLUCOSE 127 (H) 04/10/2023 0802   BUN 15 04/10/2023 0802   CREATININE 1.08 04/10/2023 0802   CALCIUM 9.4 04/10/2023 0802   PROT 7.7 11/12/2021 0342   ALBUMIN 3.8 11/12/2021 0342   AST 26 11/12/2021 0342   ALT 18 11/12/2021 0342   ALKPHOS 53 11/12/2021 0342   BILITOT 0.5 11/12/2021 0342   GFRNONAA >60 04/10/2023 0802   GFRAA >60 05/06/2019 1148   No results found for: CHOL, HDL, LDLCALC, LDLDIRECT, TRIG, CHOLHDL No results found for: YHAJ8R No results found for: VITAMINB12 No results found for: TSH   04/20/18 MRI cervical spine - Mild cervical degenerative changes.  No acute abnormality.   08/18/18 CT cervical - Negative for fracture or malalignment. - Straightening of the normal cervical lordosis may be positional or could reflect muscle spasm. - Emphysema.  06/19/23 CTA head / neck 1. No acute head CT finding. 3 x 6 mm focus of dural enhancement in the right frontal convexity could be a tiny insignificant meningioma or possibly a venous lakes. Neither would likely be significant. 2. No intracranial large or medium vessel occlusion or correctable stenosis. No aneurysm or  vascular malformation.   ASSESSMENT AND PLAN  73 y.o. year old male here with:   Dx:  1. Cervico-occipital neuralgia of left side   2. Cough headache     PLAN:  LEFT PARIETAL / OCCIPITAL HEADACHES (sharp pain, brief, shock) - could be occipital neuralgia, cervicogenic headaches, typically triggered by coughing and muscle spasm - try to cut down smoking (which triggers coughing spasms, and increased headaches) - continue ibuprofen  / tylenol  as needed - consider massage therapy, acupuncture, dry needling, PT evaluation if needed  Return for return to PCP, pending if symptoms worsen or fail to improve.    EDUARD FABIENE HANLON, MD 11/20/2023, 10:30 AM Certified in Neurology, Neurophysiology and Neuroimaging  [facility] [address], [address] [place], [place] [address] [phone]

## 2023-11-20 NOTE — Patient Instructions (Signed)
 LEFT PARIETAL / OCCIPITAL HEADACHES (sharp pain, brief, shock) - could be occipital neuralgia, cervicogenic headaches, typically triggered by coughing and muscle spasm - try to cut down smoking (which triggers coughing spasms, and increased headaches) - continue ibuprofen  / tylenol  as needed - consider massage therapy, acupuncture, dry needling, PT evaluation if needed
